# Patient Record
Sex: Female | Born: 1950
Health system: Southern US, Community
[De-identification: ages and names within clinical notes are randomized; demographics above are authoritative.]

## PROBLEM LIST (undated history)

## (undated) DIAGNOSIS — M1990 Unspecified osteoarthritis, unspecified site: Secondary | ICD-10-CM

## (undated) DIAGNOSIS — T753XXA Motion sickness, initial encounter: Secondary | ICD-10-CM

## (undated) DIAGNOSIS — E079 Disorder of thyroid, unspecified: Secondary | ICD-10-CM

## (undated) DIAGNOSIS — C801 Malignant (primary) neoplasm, unspecified: Secondary | ICD-10-CM

## (undated) DIAGNOSIS — Z86018 Personal history of other benign neoplasm: Secondary | ICD-10-CM

## (undated) DIAGNOSIS — E039 Hypothyroidism, unspecified: Secondary | ICD-10-CM

## (undated) DIAGNOSIS — R011 Cardiac murmur, unspecified: Secondary | ICD-10-CM

## (undated) DIAGNOSIS — K635 Polyp of colon: Secondary | ICD-10-CM

## (undated) DIAGNOSIS — M8580 Other specified disorders of bone density and structure, unspecified site: Secondary | ICD-10-CM

## (undated) DIAGNOSIS — K8020 Calculus of gallbladder without cholecystitis without obstruction: Secondary | ICD-10-CM

## (undated) DIAGNOSIS — Z8489 Family history of other specified conditions: Secondary | ICD-10-CM

## (undated) DIAGNOSIS — E785 Hyperlipidemia, unspecified: Secondary | ICD-10-CM

## (undated) DIAGNOSIS — Z860100 Personal history of colon polyps, unspecified: Secondary | ICD-10-CM

## (undated) DIAGNOSIS — K3580 Unspecified acute appendicitis: Secondary | ICD-10-CM

## (undated) HISTORY — DX: Hyperlipidemia, unspecified: E78.5

## (undated) HISTORY — DX: Disorder of thyroid, unspecified: E07.9

## (undated) HISTORY — DX: Calculus of gallbladder without cholecystitis without obstruction: K80.20

## (undated) HISTORY — DX: Polyp of colon: K63.5

## (undated) HISTORY — DX: Personal history of colonic polyps: Z86.010

## (undated) HISTORY — DX: Unspecified acute appendicitis: K35.80

## (undated) HISTORY — DX: Other specified disorders of bone density and structure, unspecified site: M85.80

## (undated) HISTORY — DX: Personal history of other benign neoplasm: Z86.018

## (undated) HISTORY — DX: Personal history of colon polyps, unspecified: Z86.0100

---
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
## 1983-12-30 HISTORY — PX: TUBAL LIGATION: SHX77

## 2015-07-29 ENCOUNTER — Encounter: Payer: Self-pay | Admitting: Family Medicine

## 2015-07-29 DIAGNOSIS — E039 Hypothyroidism, unspecified: Secondary | ICD-10-CM | POA: Insufficient documentation

## 2015-07-29 DIAGNOSIS — E785 Hyperlipidemia, unspecified: Secondary | ICD-10-CM | POA: Insufficient documentation

## 2015-07-29 DIAGNOSIS — D126 Benign neoplasm of colon, unspecified: Secondary | ICD-10-CM | POA: Insufficient documentation

## 2015-08-07 ENCOUNTER — Encounter: Payer: Self-pay | Admitting: Family Medicine

## 2015-08-07 ENCOUNTER — Ambulatory Visit (INDEPENDENT_AMBULATORY_CARE_PROVIDER_SITE_OTHER): Payer: BLUE CROSS/BLUE SHIELD | Admitting: Family Medicine

## 2015-08-07 VITALS — BP 122/78 | HR 90 | Temp 98.3°F | Resp 16 | Ht 64.0 in | Wt 163.9 lb

## 2015-08-07 DIAGNOSIS — D126 Benign neoplasm of colon, unspecified: Secondary | ICD-10-CM | POA: Diagnosis not present

## 2015-08-07 DIAGNOSIS — E039 Hypothyroidism, unspecified: Secondary | ICD-10-CM | POA: Diagnosis not present

## 2015-08-07 DIAGNOSIS — E785 Hyperlipidemia, unspecified: Secondary | ICD-10-CM | POA: Diagnosis not present

## 2015-08-07 DIAGNOSIS — Z1211 Encounter for screening for malignant neoplasm of colon: Secondary | ICD-10-CM

## 2015-08-07 NOTE — Progress Notes (Signed)
Name: Lynn Sandoval   MRN: [id number]    DOB: 1951-02-03   Date:08/07/2015       Progress Note  Subjective  Chief Complaint  Chief Complaint  Patient presents with  . Establish Care  . Labs Only  . Medication Refill    synthroid & atovastatin  . Referral    colonoscopy    HPI  [name] is a 63 year old female who is transitioning care from her previous medical provider Dr. Jenne Pane. Previous records reviewed prior to appointment and scanned into EMR. She has been off of her thyroid medication and cholesterol medication since 02/2015 and notes no symptoms. She would like a referral for repeat C-scope as she has had adenomatous polyps 5 years ago and needs a repeat. Her paternal grandmother has had colon cancer. Other than that she will schedule a physical for mammogram and PAP test as she has not had these in many years as well. No complaints or symptoms expressed today.  Patient Active Problem List   Diagnosis Date Noted  . Hyperlipidemia LDL goal <100 07/29/2015  . Hypothyroidism, adult 07/29/2015  . Adenomatous colon polyp 07/29/2015    History  Substance Use Topics  . Smoking status: Never Smoker   . Smokeless tobacco: Not on file  . Alcohol Use: 0.0 oz/week    0 Standard drinks or equivalent per week     Comment: ocassional glass of wine     Current outpatient prescriptions:  .  atorvastatin (LIPITOR) 10 MG tablet, Take 10 mg by mouth daily., Disp: , Rfl:  .  levothyroxine (SYNTHROID, LEVOTHROID) 50 MCG tablet, Take 50 mcg by mouth daily before breakfast., Disp: , Rfl:   Past Surgical History  Procedure Laterality Date  . Tonsillectomy  1960  . Cesarean section  1981, [id number], 1985  . Eye surgery  1955    Family History  Problem Relation Age of Onset  . Depression Mother   . Hearing loss Mother   . Hyperlipidemia Mother   . Hypertension Mother   . Glaucoma Mother   . Cancer Mother     melanoma  . Heart disease Father   . Heart attack Father   .  Diabetes Maternal Grandmother   . Cancer Paternal Grandmother     colon    No Known Allergies   Review of Systems  CONSTITUTIONAL: No significant weight changes, fever, chills, weakness or fatigue.  HEENT:  - Eyes: No visual changes.  - Ears: No auditory changes. No pain.  - Nose: No sneezing, congestion, runny nose. - Throat: No sore throat. No changes in swallowing. SKIN: No rash or itching.  CARDIOVASCULAR: No chest pain, chest pressure or chest discomfort. No palpitations or edema.  RESPIRATORY: No shortness of breath, cough or sputum.  GASTROINTESTINAL: No anorexia, nausea, vomiting. No changes in bowel habits. No abdominal pain or blood.  GENITOURINARY: No dysuria. No frequency. No discharge.  NEUROLOGICAL: No headache, dizziness, syncope, paralysis, ataxia, numbness or tingling in the extremities. No memory changes. No change in bowel or bladder control.  MUSCULOSKELETAL: No joint pain. No muscle pain. HEMATOLOGIC: No anemia, bleeding or bruising.  LYMPHATICS: No enlarged lymph nodes.  PSYCHIATRIC: No change in mood. No change in sleep pattern.  ENDOCRINOLOGIC: No reports of sweating, cold or heat intolerance. No polyuria or polydipsia.     Objective  BP 122/78 mmHg  Pulse 90  Temp(Src) 98.3 F (36.8 C) (Oral)  Resp 16  Ht 5\' 4"  (1.626 m)  Wt 163  lb 14.4 oz (74.345 kg)  BMI 28.12 kg/m2  SpO2 96% Body mass index is 28.12 kg/(m^2).  Physical Exam  Constitutional: Patient appears well-developed and well-nourished. In no distress.  HEENT:  - Head: Normocephalic and atraumatic.  - Ears: Bilateral TMs gray, no erythema or effusion - Nose: Nasal mucosa moist - Mouth/Throat: Oropharynx is clear and moist. No tonsillar hypertrophy or erythema. No post nasal drainage.  - Eyes: Conjunctivae clear, EOM movements normal. PERRLA. No scleral icterus.  Neck: Normal range of motion. Neck supple. No JVD present. No thyromegaly present.  Cardiovascular: Normal rate, regular  rhythm and normal heart sounds.  No murmur heard.  Pulmonary/Chest: Effort normal and breath sounds normal. No respiratory distress. Musculoskeletal: Normal range of motion bilateral UE and LE, no joint effusions. Peripheral vascular: Bilateral LE no edema. Neurological: CN II-XII grossly intact with no focal deficits. Alert and oriented to person, place, and time. Coordination, balance, strength, speech and gait are normal.  Skin: Skin is warm and dry. No rash noted. No erythema.  Psychiatric: Patient has a normal mood and affect. Behavior is normal in office today. Judgment and thought content normal in office today.   Assessment & Plan  1. Hypothyroidism, adult Recheck blood work off medication for several months now.  - CBC with Differential/Platelet - Comprehensive metabolic panel - TSH - T3, free - T4, free  2. Adenomatous colon polyp Referral placed.   - Ambulatory referral to General Surgery - CBC with Differential/Platelet - Comprehensive metabolic panel  3. Hyperlipidemia LDL goal <100 Recheck blood work off medication for several months now.  - CBC with Differential/Platelet - Comprehensive metabolic panel - Lipid panel  4. Colon cancer screening Referral placed. - Ambulatory referral to General Surgery

## 2015-08-08 LAB — CBC WITH DIFFERENTIAL/PLATELET
Basophils Absolute: 0 10*3/uL (ref 0.0–0.2)
Basos: 1 %
EOS (ABSOLUTE): 0.2 10*3/uL (ref 0.0–0.4)
Eos: 3 %
Hematocrit: 41.4 % (ref 34.0–46.6)
Hemoglobin: 14.4 g/dL (ref 11.1–15.9)
Immature Grans (Abs): 0 10*3/uL (ref 0.0–0.1)
Immature Granulocytes: 0 %
Lymphocytes Absolute: 2.1 10*3/uL (ref 0.7–3.1)
Lymphs: 38 %
MCH: 28.6 pg (ref 26.6–33.0)
MCHC: 34.8 g/dL (ref 31.5–35.7)
MCV: 82 fL (ref 79–97)
Monocytes Absolute: 0.4 10*3/uL (ref 0.1–0.9)
Monocytes: 7 %
Neutrophils Absolute: 2.8 10*3/uL (ref 1.4–7.0)
Neutrophils: 51 %
Platelets: 317 10*3/uL (ref 150–379)
RBC: 5.04 x10E6/uL (ref 3.77–5.28)
RDW: 13.8 % (ref 12.3–15.4)
WBC: 5.5 10*3/uL (ref 3.4–10.8)

## 2015-08-09 ENCOUNTER — Other Ambulatory Visit: Payer: Self-pay | Admitting: Family Medicine

## 2015-08-09 DIAGNOSIS — E039 Hypothyroidism, unspecified: Secondary | ICD-10-CM

## 2015-08-09 LAB — T4, FREE: Free T4: 1.22 ng/dL (ref 0.82–1.77)

## 2015-08-09 LAB — COMPREHENSIVE METABOLIC PANEL
ALT: 14 IU/L (ref 0–32)
AST: 16 IU/L (ref 0–40)
Albumin/Globulin Ratio: 2.3 (ref 1.1–2.5)
Albumin: 4.5 g/dL (ref 3.6–4.8)
Alkaline Phosphatase: 99 IU/L (ref 39–117)
BUN/Creatinine Ratio: 16 (ref 11–26)
BUN: 13 mg/dL (ref 8–27)
Bilirubin Total: 0.3 mg/dL (ref 0.0–1.2)
CO2: 23 mmol/L (ref 18–29)
Calcium: 9.1 mg/dL (ref 8.7–10.3)
Chloride: 100 mmol/L (ref 97–108)
Creatinine, Ser: 0.8 mg/dL (ref 0.57–1.00)
GFR calc Af Amer: 91 mL/min/{1.73_m2} (ref >59)
GFR calc non Af Amer: 79 mL/min/{1.73_m2} (ref >59)
Globulin, Total: 2 g/dL (ref 1.5–4.5)
Glucose: 97 mg/dL (ref 65–99)
Potassium: 4.6 mmol/L (ref 3.5–5.2)
Sodium: 141 mmol/L (ref 134–144)
Total Protein: 6.5 g/dL (ref 6.0–8.5)

## 2015-08-09 LAB — LIPID PANEL
Chol/HDL Ratio: 4.1 ratio units (ref 0.0–4.4)
Cholesterol, Total: 301 mg/dL — ABNORMAL HIGH (ref 100–199)
HDL: 74 mg/dL (ref >39)
LDL Calculated: 211 mg/dL — ABNORMAL HIGH (ref 0–99)
Triglycerides: 81 mg/dL (ref 0–149)
VLDL Cholesterol Cal: 16 mg/dL (ref 5–40)

## 2015-08-09 LAB — TSH: TSH: 5.74 u[IU]/mL — ABNORMAL HIGH (ref 0.450–4.500)

## 2015-08-09 LAB — T3, FREE: T3, Free: 3.2 pg/mL (ref 2.0–4.4)

## 2015-08-09 MED ORDER — LEVOTHYROXINE SODIUM 75 MCG PO TABS: 75.0000 ug | ORAL_TABLET | Freq: Every day | ORAL | Status: DC

## 2015-08-13 ENCOUNTER — Telehealth: Payer: Self-pay | Admitting: Family Medicine

## 2015-08-13 NOTE — Telephone Encounter (Signed)
I tried to contact this patient to review the results from her most recent labs, but there was no answer. A message was left for the patient to give [place] a call when she got the chance.

## 2015-08-13 NOTE — Telephone Encounter (Signed)
Patient was informed of results and that she will need repeat labs in 3 months, which will give her enough time to see if increased dosage has helped. Patient was told that we will call her when her lab order was ready and will scheduled f/u appt for that week. She said ok and thanks.  Patient was scheduled for an appt on 11/12/15 at 9:30am, so she can have her labs done on the previous Friday (11/09/15).

## 2015-08-13 NOTE — Telephone Encounter (Signed)
NEEDS LAB RESULTS

## 2015-08-13 NOTE — Telephone Encounter (Signed)
Patient is returning your call (c) [phone]

## 2015-08-20 ENCOUNTER — Other Ambulatory Visit: Payer: Self-pay

## 2015-08-20 ENCOUNTER — Telehealth: Payer: Self-pay | Admitting: Gastroenterology

## 2015-08-20 NOTE — Telephone Encounter (Signed)
Gastroenterology Pre-Procedure Review  Request Date: 09-17-2015   Requesting Physician: Dr. Bobetta Lime  PATIENT REVIEW QUESTIONS: The patient responded to the following health history questions as indicated:    1. Are you having any GI issues? no 2. Do you have a personal history of Polyps? yes ( ) 3. Do you have a family history of Colon Cancer or Polyps? yes (grandmother but not mother of father) 9. Diabetes Mellitus? no 5. Joint replacements in the past 12 months?no 6. Major health problems in the past 3 months?no 7. Any artificial heart valves, MVP, or defibrillator?no    MEDICATIONS & ALLERGIES:    Patient reports the following regarding taking any anticoagulation/antiplatelet therapy:   Plavix, Coumadin, Eliquis, Xarelto, Lovenox, Pradaxa, Brilinta, or Effient? no Aspirin? no  Patient confirms/reports the following medications:  Current Outpatient Prescriptions  Medication Sig Dispense Refill   atorvastatin (LIPITOR) 10 MG tablet Take 10 mg by mouth daily.     levothyroxine (SYNTHROID, LEVOTHROID) 75 MCG tablet Take 1 tablet (75 mcg total) by mouth daily before breakfast. 90 tablet 1   No current facility-administered medications for this visit.    Patient confirms/reports the following allergies:  No Known Allergies  No orders of the defined types were placed in this encounter.    AUTHORIZATION INFORMATION Primary Insurance: 1D#: Group #:  Secondary Insurance: 1D#: Group #:  SCHEDULE INFORMATION: Date: 09-17-2015 Time: Location:[facility]

## 2015-08-22 ENCOUNTER — Encounter: Payer: Self-pay | Admitting: Family Medicine

## 2015-09-10 ENCOUNTER — Encounter: Payer: Self-pay | Admitting: *Deleted

## 2015-09-11 ENCOUNTER — Encounter: Payer: Self-pay | Admitting: Student in an Organized Health Care Education/Training Program

## 2015-09-14 NOTE — Discharge Instructions (Signed)

## 2015-09-17 ENCOUNTER — Ambulatory Visit
Admission: RE | Admit: 2015-09-17 | Payer: BLUE CROSS/BLUE SHIELD | Source: Ambulatory Visit | Admitting: Gastroenterology

## 2015-09-17 HISTORY — DX: Motion sickness, initial encounter: T75.3XXA

## 2015-09-17 HISTORY — DX: Unspecified osteoarthritis, unspecified site: M19.90

## 2015-09-17 HISTORY — DX: Family history of other specified conditions: Z84.89

## 2015-09-17 SURGERY — COLONOSCOPY WITH PROPOFOL
Anesthesia: Choice

## 2015-10-10 ENCOUNTER — Other Ambulatory Visit: Payer: Self-pay | Admitting: Family Medicine

## 2015-10-10 DIAGNOSIS — E039 Hypothyroidism, unspecified: Secondary | ICD-10-CM

## 2015-10-10 NOTE — Telephone Encounter (Signed)
Patient is requesting a refill on Levothyroxine. She had two appointments scheduled however had to cancel both due to not having insurance. She will call back to reschedule. Requesting the refill to be sent to [facility]

## 2015-10-10 NOTE — Telephone Encounter (Signed)
Refill request was sent to Dr. Ashany Sundaram for approval and submission.  

## 2015-10-11 MED ORDER — LEVOTHYROXINE SODIUM 75 MCG PO TABS: 75.0000 ug | ORAL_TABLET | Freq: Every day | ORAL | Status: DC

## 2015-10-15 ENCOUNTER — Encounter: Payer: BLUE CROSS/BLUE SHIELD | Admitting: Family Medicine

## 2015-11-12 ENCOUNTER — Ambulatory Visit: Payer: Self-pay | Admitting: Family Medicine

## 2016-01-14 ENCOUNTER — Telehealth: Payer: Self-pay | Admitting: Gastroenterology

## 2016-01-14 ENCOUNTER — Other Ambulatory Visit: Payer: Self-pay

## 2016-01-14 NOTE — Telephone Encounter (Signed)
Patient would like to reschedule her colonoscopy from last September.

## 2016-01-14 NOTE — Telephone Encounter (Signed)
Contacted pt and rescheduled screening colonoscopy for 02/04/16 at [facility]. Pt still had instructions and rx.

## 2016-01-21 ENCOUNTER — Ambulatory Visit (INDEPENDENT_AMBULATORY_CARE_PROVIDER_SITE_OTHER): Payer: BLUE CROSS/BLUE SHIELD | Admitting: Family Medicine

## 2016-01-21 ENCOUNTER — Encounter: Payer: Self-pay | Admitting: Family Medicine

## 2016-01-21 VITALS — BP 124/82 | HR 95 | Temp 98.6°F | Resp 16 | Ht 64.0 in | Wt 167.0 lb

## 2016-01-21 DIAGNOSIS — Z1231 Encounter for screening mammogram for malignant neoplasm of breast: Secondary | ICD-10-CM | POA: Diagnosis not present

## 2016-01-21 DIAGNOSIS — Z136 Encounter for screening for cardiovascular disorders: Secondary | ICD-10-CM | POA: Diagnosis not present

## 2016-01-21 DIAGNOSIS — Z113 Encounter for screening for infections with a predominantly sexual mode of transmission: Secondary | ICD-10-CM | POA: Diagnosis not present

## 2016-01-21 DIAGNOSIS — Z23 Encounter for immunization: Secondary | ICD-10-CM | POA: Diagnosis not present

## 2016-01-21 DIAGNOSIS — IMO0001 Reserved for inherently not codable concepts without codable children: Secondary | ICD-10-CM | POA: Insufficient documentation

## 2016-01-21 DIAGNOSIS — Z13220 Encounter for screening for lipoid disorders: Secondary | ICD-10-CM | POA: Diagnosis not present

## 2016-01-21 DIAGNOSIS — E039 Hypothyroidism, unspecified: Secondary | ICD-10-CM | POA: Diagnosis not present

## 2016-01-21 DIAGNOSIS — Z0000 Encounter for general adult medical examination without abnormal findings: Secondary | ICD-10-CM | POA: Insufficient documentation

## 2016-01-21 DIAGNOSIS — Z124 Encounter for screening for malignant neoplasm of cervix: Secondary | ICD-10-CM | POA: Diagnosis not present

## 2016-01-21 NOTE — Progress Notes (Signed)
Name: Lynn Sandoval   MRN: [id number]    DOB: 08-03-1951   Date:01/21/2016       Progress Note  Subjective  Chief Complaint  Chief Complaint  Patient presents with  . Annual Exam    HPI  Patient is here today for a Complete Female Physical Exam:  [name] is a 65 year old female with history of hypothyroidism, colonic adenomatous polyp, family history colon cancer in paternal grandmother.  Repeat C-scope is scheduled for 01/2016.  In regards to there hypothyroidism her last labs in 07/2015 showed elevated TSH so her levothyroxine dose was increased. Symptoms consist of fatigue. Symptoms have present for several years. The symptoms are transient.  The problem has been stable.  Previous thyroid studies include TSH, triiodothyronine free and free thyroxine. The hypothyroidism is due to Hashimoto's disease.   The patient has has no unusual complaints. Overall feels healthy. Diet is well balanced. In general does exercise regularly but not in the past few months as she has been busy watching her granddaughter until March 2017. Sees dentist regularly and addresses vision concerns with ophthalmologist if applicable. In regards to sexual activity the patient is currently sexually active. Currently is not concerned about exposure to any STDs. She is post menopausal.   Past Medical History  Diagnosis Date  . Hyperlipidemia   . Thyroid disease   . Colon polyps   . Family history of adverse reaction to anesthesia     son - slow to wake  . Arthritis     fingers  . Motion sickness     boats    Past Surgical History  Procedure Laterality Date  . Tonsillectomy  1960  . Cesarean section  1981, [id number], 1985  . Eye surgery  1955    Family History  Problem Relation Age of Onset  . Depression Mother   . Hearing loss Mother   . Hyperlipidemia Mother   . Hypertension Mother   . Glaucoma Mother   . Cancer Mother     melanoma  . Heart disease Father   . Heart attack Father   . Diabetes  Maternal Grandmother   . Cancer Paternal Grandmother     colon    Social History   Social History  . Marital Status: Married    Spouse Name: N/A  . Number of Children: 4  . Years of Education: N/A   Occupational History  . retired    Social History Main Topics  . Smoking status: Never Smoker   . Smokeless tobacco: Not on file  . Alcohol Use: 1.8 oz/week    0 Standard drinks or equivalent, 3 Glasses of wine per week     Comment: ocassional glass of wine  . Drug Use: No  . Sexual Activity: No   Other Topics Concern  . Not on file   Social History Narrative     Current outpatient prescriptions:  .  CALCIUM PO, Take by mouth., Disp: , Rfl:  .  levothyroxine (SYNTHROID, LEVOTHROID) 75 MCG tablet, Take 1 tablet (75 mcg total) by mouth daily before breakfast., Disp: 90 tablet, Rfl: 1 .  Omega-3 Fatty Acids (FISH OIL PO), Take by mouth., Disp: , Rfl:   No Known Allergies  ROS  CONSTITUTIONAL: No significant weight changes, fever, chills, weakness or fatigue.  HEENT:  - Eyes: No visual changes.  - Ears: No auditory changes. No pain.  - Nose: No sneezing, congestion, runny nose. - Throat: No sore throat. No changes in swallowing.  SKIN: No rash or itching.  CARDIOVASCULAR: No chest pain, chest pressure or chest discomfort. No palpitations or edema.  RESPIRATORY: No shortness of breath, cough or sputum.  GASTROINTESTINAL: No anorexia, nausea, vomiting. No changes in bowel habits. No abdominal pain or blood.  GENITOURINARY: No dysuria. No frequency. No discharge.  NEUROLOGICAL: No headache, dizziness, syncope, paralysis, ataxia, numbness or tingling in the extremities. No memory changes. No change in bowel or bladder control.  MUSCULOSKELETAL: No joint pain. No muscle pain. HEMATOLOGIC: No anemia, bleeding or bruising.  LYMPHATICS: No enlarged lymph nodes.  PSYCHIATRIC: No change in mood. No change in sleep pattern.  ENDOCRINOLOGIC: No reports of sweating, cold or heat  intolerance. No polyuria or polydipsia.   Objective  Filed Vitals:   01/21/16 1403  BP: 124/82  Pulse: 95  Temp: 98.6 F (37 C)  TempSrc: Oral  Resp: 16  Height: 5\' 4"  (1.626 m)  Weight: 167 lb (75.751 kg)  SpO2: 95%   Body mass index is 28.65 kg/(m^2).  Depression screen [facility] 2/9 01/21/2016 08/07/2015  Decreased Interest 0 1  Down, Depressed, Hopeless 1 1  PHQ - 2 Score 1 2  Altered sleeping - 3  Tired, decreased energy - 2  Change in appetite - 0  Feeling bad or failure about yourself  - 1  Trouble concentrating - 1  Moving slowly or fidgety/restless - 0  Suicidal thoughts - 0  PHQ-9 Score - 9  Difficult doing work/chores - Not difficult at all     Physical Exam  Constitutional: Patient appears well-developed and well-nourished. In no distress.  HEENT:  - Head: Normocephalic and atraumatic.  - Ears: Bilateral TMs gray, no erythema or effusion - Nose: Nasal mucosa moist - Mouth/Throat: Oropharynx is clear and moist. No tonsillar hypertrophy or erythema. No post nasal drainage.  - Eyes: Conjunctivae clear, EOM movements normal. PERRLA. No scleral icterus. Wearing corrective eye glasses.  Neck: Normal range of motion. Neck supple. No JVD present. No thyromegaly present.  Cardiovascular: Normal rate, regular rhythm and normal heart sounds.  No murmur heard.  Pulmonary/Chest: Effort normal and breath sounds normal. No respiratory distress. Abdominal: Soft. Bowel sounds are normal, no distension. There is no tenderness. no masses BREAST: Bilateral breast exam normal with no masses, skin changes or nipple discharge FEMALE GENITALIA: Pedunculated skin tag left inner thigh about 67mm length. External genitalia normal External urethra normal Vaginal vault normal without discharge or lesions Cervix normal without discharge or lesions Bimanual exam normal without masses RECTAL: no rectal masses or hemorrhoids Musculoskeletal: Normal range of motion bilateral UE and LE, no  joint effusions. Peripheral vascular: Bilateral LE no edema. Neurological: CN II-XII grossly intact with no focal deficits. Alert and oriented to person, place, and time. Coordination, balance, strength, speech and gait are normal.  Skin: Skin is warm and dry. Scattered nevi, cherry hemangiomas, seborrheic keratosis.  Psychiatric: Patient has a normal mood and affect. Behavior is normal in office today. Judgment and thought content normal in office today.   Assessment & Plan  1. Annual physical exam Discussed in detail all recommended preventative measures appropriate for age and gender now and in the future.  Recommended dermatological evaluation of multiple skin lesions.  - CBC with Differential/Platelet - Comprehensive metabolic panel  2. Needs flu shot  - Flu Vaccine QUAD 36+ mos PF IM (Fluarix & Fluzone Quad PF)  3. Encounter for cholesteral screening for cardiovascular disease  - Lipid panel  4. Hypothyroidism, adult Doing well on current dose. Due  to have thyroid hormone labs checked.  - CBC with Differential/Platelet - Comprehensive metabolic panel - Lipid panel - TSH - T3, free - T4, free  5. Screening for STD (sexually transmitted disease)  - HIV antibody - Hepatitis C antibody  6. Encounter for screening mammogram for malignant neoplasm of breast  - MM Digital Screening; Future  7. Encounter for screening for cervical cancer   - Pap IG w/ reflex to HPV when ASC-U

## 2016-01-22 NOTE — Telephone Encounter (Signed)
No authorization is required for CPT: [phone] with [employer] per [facility]--Reference number [id number] as long as this is done as outpatient.

## 2016-01-24 LAB — CBC WITH DIFFERENTIAL/PLATELET
Basophils Absolute: 0 10*3/uL (ref 0.0–0.2)
Basos: 1 %
EOS (ABSOLUTE): 0.2 10*3/uL (ref 0.0–0.4)
Eos: 3 %
Hematocrit: 42.9 % (ref 34.0–46.6)
Hemoglobin: 14.5 g/dL (ref 11.1–15.9)
Immature Grans (Abs): 0 10*3/uL (ref 0.0–0.1)
Immature Granulocytes: 0 %
Lymphocytes Absolute: 2.1 10*3/uL (ref 0.7–3.1)
Lymphs: 34 %
MCH: 28.6 pg (ref 26.6–33.0)
MCHC: 33.8 g/dL (ref 31.5–35.7)
MCV: 85 fL (ref 79–97)
Monocytes Absolute: 0.4 10*3/uL (ref 0.1–0.9)
Monocytes: 7 %
Neutrophils Absolute: 3.3 10*3/uL (ref 1.4–7.0)
Neutrophils: 55 %
Platelets: 283 10*3/uL (ref 150–379)
RBC: 5.07 x10E6/uL (ref 3.77–5.28)
RDW: 13.8 % (ref 12.3–15.4)
WBC: 6 10*3/uL (ref 3.4–10.8)

## 2016-01-24 LAB — COMPREHENSIVE METABOLIC PANEL
ALT: 21 IU/L (ref 0–32)
AST: 18 IU/L (ref 0–40)
Albumin/Globulin Ratio: 2.1 (ref 1.1–2.5)
Albumin: 4.5 g/dL (ref 3.6–4.8)
Alkaline Phosphatase: 103 IU/L (ref 39–117)
BUN/Creatinine Ratio: 21 (ref 11–26)
BUN: 16 mg/dL (ref 8–27)
Bilirubin Total: 0.4 mg/dL (ref 0.0–1.2)
CO2: 24 mmol/L (ref 18–29)
Calcium: 8.9 mg/dL (ref 8.7–10.3)
Chloride: 100 mmol/L (ref 96–106)
Creatinine, Ser: 0.78 mg/dL (ref 0.57–1.00)
GFR calc Af Amer: 94 mL/min/{1.73_m2} (ref >59)
GFR calc non Af Amer: 81 mL/min/{1.73_m2} (ref >59)
Globulin, Total: 2.1 g/dL (ref 1.5–4.5)
Glucose: 95 mg/dL (ref 65–99)
Potassium: 4.5 mmol/L (ref 3.5–5.2)
Sodium: 141 mmol/L (ref 134–144)
Total Protein: 6.6 g/dL (ref 6.0–8.5)

## 2016-01-24 LAB — PAP IG W/ RFLX HPV ASCU: PAP Smear Comment: 0

## 2016-01-24 LAB — LIPID PANEL
Chol/HDL Ratio: 3.9 ratio units (ref 0.0–4.4)
Cholesterol, Total: 283 mg/dL — ABNORMAL HIGH (ref 100–199)
HDL: 73 mg/dL (ref >39)
LDL Calculated: 191 mg/dL — ABNORMAL HIGH (ref 0–99)
Triglycerides: 94 mg/dL (ref 0–149)
VLDL Cholesterol Cal: 19 mg/dL (ref 5–40)

## 2016-01-24 LAB — TSH: TSH: 2.05 u[IU]/mL (ref 0.450–4.500)

## 2016-01-24 LAB — T3, FREE: T3, Free: 3.4 pg/mL (ref 2.0–4.4)

## 2016-01-24 LAB — HEPATITIS C ANTIBODY: Hep C Virus Ab: 0.9 s/co ratio (ref 0.0–0.9)

## 2016-01-24 LAB — T4, FREE: Free T4: 1.7 ng/dL (ref 0.82–1.77)

## 2016-01-24 LAB — HIV ANTIBODY (ROUTINE TESTING W REFLEX): HIV Screen 4th Generation wRfx: NONREACTIVE

## 2016-01-31 ENCOUNTER — Ambulatory Visit: Payer: BLUE CROSS/BLUE SHIELD

## 2016-01-31 NOTE — Discharge Instructions (Signed)

## 2016-02-04 ENCOUNTER — Ambulatory Visit
Admission: RE | Admit: 2016-02-04 | Discharge: 2016-02-04 | Disposition: A | Discharge status: Home / Self Care | Payer: BLUE CROSS/BLUE SHIELD | Source: Ambulatory Visit | Attending: Gastroenterology | Admitting: Gastroenterology

## 2016-02-04 ENCOUNTER — Encounter
Admission: RE | Disposition: A | Discharge status: Home / Self Care | Payer: Self-pay | Source: Ambulatory Visit | Attending: Gastroenterology

## 2016-02-04 ENCOUNTER — Other Ambulatory Visit: Payer: Self-pay | Admitting: Gastroenterology

## 2016-02-04 ENCOUNTER — Ambulatory Visit: Payer: BLUE CROSS/BLUE SHIELD | Admitting: Anesthesiology

## 2016-02-04 ENCOUNTER — Encounter: Payer: Self-pay | Admitting: *Deleted

## 2016-02-04 DIAGNOSIS — Z9889 Other specified postprocedural states: Secondary | ICD-10-CM | POA: Insufficient documentation

## 2016-02-04 DIAGNOSIS — Z818 Family history of other mental and behavioral disorders: Secondary | ICD-10-CM | POA: Insufficient documentation

## 2016-02-04 DIAGNOSIS — Z8249 Family history of ischemic heart disease and other diseases of the circulatory system: Secondary | ICD-10-CM | POA: Diagnosis not present

## 2016-02-04 DIAGNOSIS — E039 Hypothyroidism, unspecified: Secondary | ICD-10-CM | POA: Insufficient documentation

## 2016-02-04 DIAGNOSIS — M19049 Primary osteoarthritis, unspecified hand: Secondary | ICD-10-CM | POA: Insufficient documentation

## 2016-02-04 DIAGNOSIS — Z860100 Personal history of colon polyps, unspecified: Secondary | ICD-10-CM | POA: Insufficient documentation

## 2016-02-04 DIAGNOSIS — D122 Benign neoplasm of ascending colon: Secondary | ICD-10-CM | POA: Diagnosis not present

## 2016-02-04 DIAGNOSIS — D123 Benign neoplasm of transverse colon: Secondary | ICD-10-CM | POA: Insufficient documentation

## 2016-02-04 DIAGNOSIS — Z79899 Other long term (current) drug therapy: Secondary | ICD-10-CM | POA: Diagnosis not present

## 2016-02-04 DIAGNOSIS — Z1211 Encounter for screening for malignant neoplasm of colon: Secondary | ICD-10-CM | POA: Diagnosis present

## 2016-02-04 DIAGNOSIS — Z833 Family history of diabetes mellitus: Secondary | ICD-10-CM | POA: Diagnosis not present

## 2016-02-04 DIAGNOSIS — Z800 Family history of malignant neoplasm of digestive organs: Secondary | ICD-10-CM | POA: Insufficient documentation

## 2016-02-04 DIAGNOSIS — E785 Hyperlipidemia, unspecified: Secondary | ICD-10-CM | POA: Diagnosis not present

## 2016-02-04 DIAGNOSIS — K641 Second degree hemorrhoids: Secondary | ICD-10-CM | POA: Diagnosis not present

## 2016-02-04 DIAGNOSIS — Z808 Family history of malignant neoplasm of other organs or systems: Secondary | ICD-10-CM | POA: Insufficient documentation

## 2016-02-04 DIAGNOSIS — Z83511 Family history of glaucoma: Secondary | ICD-10-CM | POA: Insufficient documentation

## 2016-02-04 HISTORY — PX: POLYPECTOMY: SHX5525

## 2016-02-04 HISTORY — PX: COLONOSCOPY WITH PROPOFOL: SHX5780

## 2016-02-04 HISTORY — DX: Hypothyroidism, unspecified: E03.9

## 2016-02-04 SURGERY — COLONOSCOPY WITH PROPOFOL
Anesthesia: Monitor Anesthesia Care

## 2016-02-04 MED ORDER — ACETAMINOPHEN 325 MG PO TABS: 325.0000 mg | ORAL_TABLET | ORAL | Status: DC | PRN

## 2016-02-04 MED ORDER — LACTATED RINGERS IV SOLN
INTRAVENOUS | Status: DC
  Administered 2016-02-04: 08:00:00 via INTRAVENOUS

## 2016-02-04 MED ORDER — STERILE WATER FOR IRRIGATION IR SOLN
Status: DC | PRN
  Administered 2016-02-04: 08:00:00

## 2016-02-04 MED ORDER — ACETAMINOPHEN 160 MG/5ML PO SOLN: 325.0000 mg | ORAL | Status: DC | PRN

## 2016-02-04 MED ORDER — PROPOFOL 10 MG/ML IV BOLUS
INTRAVENOUS | Status: DC | PRN
  Administered 2016-02-04 (×3): 20 mg via INTRAVENOUS

## 2016-02-04 SURGICAL SUPPLY — 28 items

## 2016-02-04 NOTE — H&P (Addendum)
  [facility]  [address], [place], [place] [address] Phone: [phone] Fax : [phone]  Primary Care Physician:  Bobetta Lime, MD Primary Gastroenterologist:  Dr. Allen Norris  Pre-Procedure History & Physical: HPI:  Lynn Sandoval is a 65 y.o. female is here for a screening colonoscopy.   Past Medical History  Diagnosis Date  . Hyperlipidemia   . Thyroid disease   . Colon polyps   . Family history of adverse reaction to anesthesia     son - slow to wake  . Motion sickness     boats  . Arthritis     fingers  . Hypothyroidism     Past Surgical History  Procedure Laterality Date  . Tonsillectomy  1960  . Cesarean section  1981, [id number], 1985  . Eye surgery  1955  . Tonsillectomy      Prior to Admission medications   Medication Sig Start Date End Date Taking? Authorizing Provider  CALCIUM PO Take by mouth.   Yes Historical Provider, MD  levothyroxine (SYNTHROID, LEVOTHROID) 75 MCG tablet Take 1 tablet (75 mcg total) by mouth daily before breakfast. 10/11/15  Yes Bobetta Lime, MD  Omega-3 Fatty Acids (FISH OIL PO) Take by mouth.   Yes Historical Provider, MD    Allergies as of 01/14/2016  . (No Known Allergies)    Family History  Problem Relation Age of Onset  . Depression Mother   . Hearing loss Mother   . Hyperlipidemia Mother   . Hypertension Mother   . Glaucoma Mother   . Cancer Mother     melanoma  . Heart disease Father   . Heart attack Father   . Diabetes Maternal Grandmother   . Cancer Paternal Grandmother     colon    Social History   Social History  . Marital Status: Married    Spouse Name: N/A  . Number of Children: 4  . Years of Education: N/A   Occupational History  . retired    Social History Main Topics  . Smoking status: Never Smoker   . Smokeless tobacco: Not on file  . Alcohol Use: 1.8 oz/week    3 Glasses of wine, 0 Standard drinks or equivalent per week     Comment: ocassional glass of wine  . Drug Use:  No  . Sexual Activity: No   Other Topics Concern  . Not on file   Social History Narrative    Review of Systems: See HPI, otherwise negative ROS  Physical Exam: BP 132/79 mmHg  Pulse 93  Temp(Src) 98.2 F (36.8 C)  Resp 16  Ht 5\' 4"  (1.626 m)  Wt 161 lb (73.029 kg)  BMI 27.62 kg/m2  SpO2 98% General:   Alert,  pleasant and cooperative in NAD Head:  Normocephalic and atraumatic. Neck:  Supple; no masses or thyromegaly. Lungs:  Clear throughout to auscultation.    Heart:  Regular rate and rhythm. Abdomen:  Soft, nontender and nondistended. Normal bowel sounds, without guarding, and without rebound.   Neurologic:  Alert and  oriented x4;  grossly normal neurologically.  Impression/Plan: Lynn Sandoval is now here to undergo a history of polyps Risks, benefits, and alternatives regarding colonoscopy have been reviewed with the patient.  Questions have been answered.  All parties agreeable.

## 2016-02-04 NOTE — Op Note (Signed)
[facility] Gastroenterology Patient Name: Lynn Sandoval Procedure Date: 02/04/2016 8:15 AM MRN: [id number] Account #: [id number] Date of Birth: Feb 11, 1951 Admit Type: Outpatient Age: 65 Room: [facility] OR ROOM 01 Gender: Female Note Status: Finalized Procedure:         Colonoscopy Indications:       High risk colon cancer surveillance: Personal history of                     colonic polyps Providers:         Lucilla Lame, MD Referring MD:      Bobetta Lime (Referring MD) Medicines:         Propofol per Anesthesia Complications:     No immediate complications. Procedure:         Pre-Anesthesia Assessment:                    - Prior to the procedure, a History and Physical was                     performed, and patient medications and allergies were                     reviewed. The patient's tolerance of previous anesthesia                     was also reviewed. The risks and benefits of the procedure                     and the sedation options and risks were discussed with the                     patient. All questions were answered, and informed consent                     was obtained. Prior Anticoagulants: The patient has taken                     no previous anticoagulant or antiplatelet agents. ASA                     Grade Assessment: II - A patient with mild systemic                     disease. After reviewing the risks and benefits, the                     patient was deemed in satisfactory condition to undergo                     the procedure.                    After obtaining informed consent, the colonoscope was                     passed under direct vision. Throughout the procedure, the                     patient's blood pressure, pulse, and oxygen saturations                     were monitored continuously. The [place]  colonoscope (S#: [id number]) was introduced through the anus                     and advanced to the the  cecum, identified by appendiceal                     orifice and ileocecal valve. The colonoscopy was performed                     without difficulty. The patient tolerated the procedure                     well. The quality of the bowel preparation was excellent. Findings:      The perianal and digital rectal examinations were normal.      A 4 mm polyp was found in the ascending colon. The polyp was sessile.       The polyp was removed with a cold biopsy forceps. Resection and       retrieval were complete.      A 4 mm polyp was found in the transverse colon. The polyp was sessile.       The polyp was removed with a cold biopsy forceps. Resection and       retrieval were complete.      Non-bleeding internal hemorrhoids were found during retroflexion. The       hemorrhoids were Grade II (internal hemorrhoids that prolapse but reduce       spontaneously). Impression:        - One 4 mm polyp in the ascending colon. Resected and                     retrieved.                    - One 4 mm polyp in the transverse colon. Resected and                     retrieved.                    - Non-bleeding internal hemorrhoids. Recommendation:    - Await pathology results.                    - Repeat colonoscopy in 5 years for surveillance. Procedure Code(s): --- Professional ---                    [phone], Colonoscopy, flexible; with biopsy, single or                     multiple Diagnosis Code(s): --- Professional ---                    Z86.010, Personal history of colonic polyps                    D12.2, Benign neoplasm of ascending colon                    D12.3, Benign neoplasm of transverse colon CPT copyright 2014 American Medical Association. All rights reserved. The codes documented in this report are preliminary and upon coder review may  be revised to meet current compliance requirements. Lucilla Lame, MD 02/04/2016 8:37:08 AM This report has been signed electronically. Number of Addenda:  0 Note Initiated On: 02/04/2016 8:15 AM Scope Withdrawal Time: 0  hours 6 minutes 51 seconds  Total Procedure Duration: 0 hours 11 minutes 5 seconds       [facility]

## 2016-02-04 NOTE — Anesthesia Procedure Notes (Signed)
Procedure Name: MAC Performed by: Elon Eoff Pre-anesthesia Checklist: Patient identified, Emergency Drugs available, Suction available, Patient being monitored and Timeout performed Patient Re-evaluated:Patient Re-evaluated prior to inductionOxygen Delivery Method: Nasal cannula Preoxygenation: Pre-oxygenation with 100% oxygen     

## 2016-02-04 NOTE — Anesthesia Postprocedure Evaluation (Signed)
Anesthesia Post Note  Patient: Lynn Sandoval  Procedure(s) Performed: Procedure(s) (LRB): COLONOSCOPY WITH PROPOFOL (N/A) POLYPECTOMY  Patient location during evaluation: PACU Anesthesia Type: MAC Level of consciousness: awake and alert and oriented Pain management: satisfactory to patient Vital Signs Assessment: post-procedure vital signs reviewed and stable Respiratory status: spontaneous breathing, nonlabored ventilation and respiratory function stable Cardiovascular status: blood pressure returned to baseline and stable Postop Assessment: Adequate PO intake and No signs of nausea or vomiting Anesthetic complications: no    Raliegh Ip

## 2016-02-04 NOTE — Transfer of Care (Signed)
Immediate Anesthesia Transfer of Care Note  Patient: Lynn Sandoval  Procedure(s) Performed: Procedure(s): COLONOSCOPY WITH PROPOFOL (N/A) POLYPECTOMY  Patient Location: PACU  Anesthesia Type: MAC  Level of Consciousness: awake, alert  and patient cooperative  Airway and Oxygen Therapy: Patient Spontanous Breathing and Patient connected to supplemental oxygen  Post-op Assessment: Post-op Vital signs reviewed, Patient's Cardiovascular Status Stable, Respiratory Function Stable, Patent Airway and No signs of Nausea or vomiting  Post-op Vital Signs: Reviewed and stable  Complications: No apparent anesthesia complications

## 2016-02-04 NOTE — Anesthesia Preprocedure Evaluation (Signed)
Anesthesia Evaluation  Patient identified by MRN, date of birth, ID band  Reviewed: Allergy & Precautions, H&P , NPO status , Patient's Chart, lab work & pertinent test results  Airway Mallampati: II  TM Distance: >3 FB Neck ROM: full    Dental no notable dental hx.    Pulmonary    Pulmonary exam normal       Cardiovascular Rhythm:regular Rate:Normal     Neuro/Psych    GI/Hepatic   Endo/Other  Hypothyroidism   Renal/GU      Musculoskeletal   Abdominal   Peds  Hematology   Anesthesia Other Findings   Reproductive/Obstetrics                             Anesthesia Physical Anesthesia Plan  ASA: II  Anesthesia Plan: MAC   Post-op Pain Management:    Induction:   Airway Management Planned:   Additional Equipment:   Intra-op Plan:   Post-operative Plan:   Informed Consent: I have reviewed the patients History and Physical, chart, labs and discussed the procedure including the risks, benefits and alternatives for the proposed anesthesia with the patient or authorized representative who has indicated his/her understanding and acceptance.     Plan Discussed with: CRNA  Anesthesia Plan Comments:         Anesthesia Quick Evaluation  

## 2016-02-05 ENCOUNTER — Encounter: Payer: Self-pay | Admitting: Gastroenterology

## 2016-02-06 ENCOUNTER — Ambulatory Visit
Admission: RE | Admit: 2016-02-06 | Discharge: 2016-02-06 | Disposition: A | Discharge status: Home / Self Care | Payer: BLUE CROSS/BLUE SHIELD | Source: Ambulatory Visit | Attending: Family Medicine | Admitting: Family Medicine

## 2016-02-06 ENCOUNTER — Encounter: Payer: Self-pay | Admitting: Gastroenterology

## 2016-02-06 ENCOUNTER — Other Ambulatory Visit: Payer: Self-pay

## 2016-02-06 DIAGNOSIS — Z1231 Encounter for screening mammogram for malignant neoplasm of breast: Secondary | ICD-10-CM

## 2016-02-06 DIAGNOSIS — E039 Hypothyroidism, unspecified: Secondary | ICD-10-CM

## 2016-02-06 MED ORDER — LEVOTHYROXINE SODIUM 75 MCG PO TABS: 75.0000 ug | ORAL_TABLET | Freq: Every day | ORAL | Status: DC

## 2016-02-07 ENCOUNTER — Other Ambulatory Visit: Payer: Self-pay | Admitting: Family Medicine

## 2016-02-08 ENCOUNTER — Encounter: Payer: Self-pay | Admitting: Gastroenterology

## 2016-07-21 ENCOUNTER — Ambulatory Visit (INDEPENDENT_AMBULATORY_CARE_PROVIDER_SITE_OTHER): Payer: BLUE CROSS/BLUE SHIELD | Admitting: Family Medicine

## 2016-07-21 ENCOUNTER — Encounter: Payer: Self-pay | Admitting: Family Medicine

## 2016-07-21 VITALS — BP 114/68 | HR 87 | Temp 98.8°F | Resp 14 | Wt 163.0 lb

## 2016-07-21 DIAGNOSIS — E039 Hypothyroidism, unspecified: Secondary | ICD-10-CM | POA: Diagnosis not present

## 2016-07-21 DIAGNOSIS — E785 Hyperlipidemia, unspecified: Secondary | ICD-10-CM | POA: Diagnosis not present

## 2016-07-21 DIAGNOSIS — Z808 Family history of malignant neoplasm of other organs or systems: Secondary | ICD-10-CM

## 2016-07-21 DIAGNOSIS — E538 Deficiency of other specified B group vitamins: Secondary | ICD-10-CM | POA: Diagnosis not present

## 2016-07-21 MED ORDER — CYANOCOBALAMIN 1000 MCG/ML IJ SOLN: 1000.0000 ug | Freq: Once | INTRAMUSCULAR | Status: DC

## 2016-07-21 NOTE — Assessment & Plan Note (Addendum)
Refer to dermatologist for monitoring with fair skin, hx of sunburns, positive family hx of melanoma

## 2016-07-21 NOTE — Assessment & Plan Note (Addendum)
Check TSH today; if normal, then okay to check once a year

## 2016-07-21 NOTE — Progress Notes (Signed)
BP 114/68   Pulse 87   Temp 98.8 F (37.1 C) (Oral)   Resp 14   Wt 163 lb (73.9 kg)   SpO2 99%   BMI 27.98 kg/m    Subjective:    Patient ID: Lynn Sandoval, female    DOB: 01-22-1951, 65 y.o.   MRN: [id number]  HPI: Lynn Sandoval is a 65 y.o. female  Chief Complaint  Patient presents with  . Follow-up   Patient is new to me; her previous provider left the office  She has hypothyroidism; her last doctor increased her dose from 50 mcg to 75 mcg daily; that happened last fall; last check was January; no surgery or radiation; that is hereditary too, mother had surgery on thyroid in her 31's; grandmother had thyroid problems Lab Results  Component Value Date   TSH 2.050 01/23/2016  that was drawn months after the change from 50 mg to 75 mcg daily  High cholesterol; patient used to take statin, but doctor took her off; no problems, no muscle aches, no liver enzyme problems; does eat some fatty meats; maybe once a week, bacon and eggs on Saturday  Lab Results  Component Value Date   CHOL 283 (H) 01/23/2016   CHOL 301 (H) 08/08/2015   Lab Results  Component Value Date   HDL 73 01/23/2016   HDL 74 08/08/2015   Lab Results  Component Value Date   LDLCALC 191 (H) 01/23/2016   LDLCALC 211 (H) 08/08/2015   Lab Results  Component Value Date   TRIG 94 01/23/2016   TRIG 81 08/08/2015   Lab Results  Component Value Date   CHOLHDL 3.9 01/23/2016   CHOLHDL 4.1 08/08/2015   No results found for: LDLDIRECT  Her father has heart issues; he had a heart attack 20 years ago; he is still living; her mother is on cholesterol medicine She does not take an aspirin daily  She has fair skin and mother has had melanomas; would like referral to dermatologist  Depression screen [facility] 2/9 07/21/2016 01/21/2016 08/07/2015  Decreased Interest 0 0 1  Down, Depressed, Hopeless 0 1 1  PHQ - 2 Score 0 1 2  Altered sleeping - - 3  Tired, decreased energy - - 2  Change in appetite - - 0  Feeling  bad or failure about yourself  - - 1  Trouble concentrating - - 1  Moving slowly or fidgety/restless - - 0  Suicidal thoughts - - 0  PHQ-9 Score - - 9  Difficult doing work/chores - - Not difficult at all   Relevant past medical, surgical, family and social history reviewed Past Medical History:  Diagnosis Date  . Arthritis    fingers  . Colon polyps   . Family history of adverse reaction to anesthesia    son - slow to wake  . Hyperlipidemia   . Hypothyroidism   . Motion sickness    boats  . Thyroid disease    Past Surgical History:  Procedure Laterality Date  . [place]  . COLONOSCOPY WITH PROPOFOL N/A 02/04/2016   Procedure: COLONOSCOPY WITH PROPOFOL;  Surgeon: Lucilla Lame, MD;  Location: [place];  Service: Endoscopy;  Laterality: N/A;  . EYE SURGERY  1955  . POLYPECTOMY  02/04/2016   Procedure: POLYPECTOMY;  Surgeon: Lucilla Lame, MD;  Location: [place];  Service: Endoscopy;;  . TONSILLECTOMY  1960  . TONSILLECTOMY     Family History  Problem Relation Age of  Onset  . Depression Mother   . Hearing loss Mother   . Hyperlipidemia Mother   . Hypertension Mother   . Glaucoma Mother   . Cancer Mother     melanoma  . Heart disease Father   . Heart attack Father   . Diabetes Maternal Grandmother   . Cancer Paternal Grandmother     colon   Social History  Substance Use Topics  . Smoking status: Never Smoker  . Smokeless tobacco: Not on file  . Alcohol use 1.8 oz/week    3 Glasses of wine per week     Comment: ocassional glass of wine   Interim medical history since last visit reviewed. Allergies and medications reviewed  Review of Systems  Cardiovascular: Negative for chest pain and palpitations.  Musculoskeletal: Negative for myalgias.   Per HPI unless specifically indicated above     Objective:    BP 114/68   Pulse 87   Temp 98.8 F (37.1 C) (Oral)   Resp 14   Wt 163 lb (73.9 kg)   SpO2 99%   BMI 27.98  kg/m   Wt Readings from Last 3 Encounters:  07/21/16 163 lb (73.9 kg)  02/04/16 161 lb (73 kg)  01/21/16 167 lb (75.8 kg)    Physical Exam  Constitutional: She appears well-developed and well-nourished. No distress.  HENT:  Head: Normocephalic and atraumatic.  Eyes: EOM are normal. No scleral icterus.  Neck: No thyromegaly present.  Cardiovascular: Normal rate, regular rhythm and normal heart sounds.   No murmur heard. Pulmonary/Chest: Effort normal and breath sounds normal. No respiratory distress. She has no wheezes.  Abdominal: Soft. Bowel sounds are normal. She exhibits no distension.  Musculoskeletal: Normal range of motion. She exhibits no edema.  Neurological: She is alert.  Skin: Skin is warm and dry. She is not diaphoretic. No pallor.  Psychiatric: She has a normal mood and affect. Her behavior is normal. Judgment and thought content normal.   Results for orders placed or performed in visit on 01/21/16  CBC with Differential/Platelet  Result Value Ref Range   WBC 6.0 3.4 - 10.8 x10E3/uL   RBC 5.07 3.77 - 5.28 x10E6/uL   Hemoglobin 14.5 11.1 - 15.9 g/dL   Hematocrit 42.9 34.0 - 46.6 %   MCV 85 79 - 97 fL   MCH 28.6 26.6 - 33.0 pg   MCHC 33.8 31.5 - 35.7 g/dL   RDW 13.8 12.3 - 15.4 %   Platelets 283 150 - 379 x10E3/uL   Neutrophils 55 %   Lymphs 34 %   Monocytes 7 %   Eos 3 %   Basos 1 %   Neutrophils Absolute 3.3 1.4 - 7.0 x10E3/uL   Lymphocytes Absolute 2.1 0.7 - 3.1 x10E3/uL   Monocytes Absolute 0.4 0.1 - 0.9 x10E3/uL   EOS (ABSOLUTE) 0.2 0.0 - 0.4 x10E3/uL   Basophils Absolute 0.0 0.0 - 0.2 x10E3/uL   Immature Granulocytes 0 %   Immature Grans (Abs) 0.0 0.0 - 0.1 x10E3/uL  Comprehensive metabolic panel  Result Value Ref Range   Glucose 95 65 - 99 mg/dL   BUN 16 8 - 27 mg/dL   Creatinine, Ser 0.78 0.57 - 1.00 mg/dL   GFR calc non Af Amer 81 >59 mL/min/1.73   GFR calc Af Amer 94 >59 mL/min/1.73   BUN/Creatinine Ratio 21 11 - 26   Sodium 141 134 - 144  mmol/L   Potassium 4.5 3.5 - 5.2 mmol/L   Chloride 100 96 - 106  mmol/L   CO2 24 18 - 29 mmol/L   Calcium 8.9 8.7 - 10.3 mg/dL   Total Protein 6.6 6.0 - 8.5 g/dL   Albumin 4.5 3.6 - 4.8 g/dL   Globulin, Total 2.1 1.5 - 4.5 g/dL   Albumin/Globulin Ratio 2.1 1.1 - 2.5   Bilirubin Total 0.4 0.0 - 1.2 mg/dL   Alkaline Phosphatase 103 39 - 117 IU/L   AST 18 0 - 40 IU/L   ALT 21 0 - 32 IU/L  Lipid panel  Result Value Ref Range   Cholesterol, Total 283 (H) 100 - 199 mg/dL   Triglycerides 94 0 - 149 mg/dL   HDL 73 >39 mg/dL   VLDL Cholesterol Cal 19 5 - 40 mg/dL   LDL Calculated 191 (H) 0 - 99 mg/dL   Comment: Comment    Chol/HDL Ratio 3.9 0.0 - 4.4 ratio units  TSH  Result Value Ref Range   TSH 2.050 0.450 - 4.500 uIU/mL  T3, free  Result Value Ref Range   T3, Free 3.4 2.0 - 4.4 pg/mL  T4, free  Result Value Ref Range   Free T4 1.70 0.82 - 1.77 ng/dL  HIV antibody  Result Value Ref Range   HIV Screen 4th Generation wRfx Non Reactive Non Reactive  Hepatitis C antibody  Result Value Ref Range   Hep C Virus Ab 0.9 0.0 - 0.9 s/co ratio  Pap IG w/ reflex to HPV when ASC-U  Result Value Ref Range   DIAGNOSIS: Comment    Specimen adequacy: Comment    CLINICIAN PROVIDED ICD10: Comment    Performed by: Comment    PAP SMEAR COMMENT .    Note: Comment    Test Methodology Comment    PAP REFLEX: Comment       Assessment & Plan:   Problem List Items Addressed This Visit      Endocrine   Hypothyroidism, adult    Check TSH today; if normal, then okay to check once a year      Relevant Orders   TSH     Other   Hyperlipidemia LDL goal <100    check fasting lipids; limit saturated fats; see AVS; I have no doubt this is inherited; we ran the 10 year ASCVD risk calculator and even with her elevated LDL, she is less than 5% risk of event in the next 10 years (her number is 4.3%)      Relevant Orders   Lipid panel   Family hx of melanoma    Refer to dermatologist for monitoring  with fair skin, hx of sunburns, positive family hx of melanoma      Relevant Orders   Ambulatory referral to Dermatology    Other Visit Diagnoses   None.     Follow up plan: Return in about 7 months (around 02/16/2017) for Welcome to Medicare visit, 40 minutes.  An after-visit summary was printed and given to the patient at [facility].  Please see the patient instructions which may contain other information and recommendations beyond what is mentioned above in the assessment and plan.  No orders of the defined types were placed in this encounter.  Orders Placed This Encounter  Procedures  . Lipid panel  . TSH  . Ambulatory referral to Dermatology

## 2016-07-21 NOTE — Assessment & Plan Note (Addendum)
check fasting lipids; limit saturated fats; see AVS; I have no doubt this is inherited; we ran the 10 year ASCVD risk calculator and even with her elevated LDL, she is less than 5% risk of event in the next 10 years (her number is 4.3%)

## 2016-07-21 NOTE — Patient Instructions (Addendum)
Try to limit saturated fats in your diet (bologna, hot dogs, barbeque, cheeseburgers, hamburgers, steak, bacon, sausage, cheese, etc.) and get more fresh fruits, vegetables, and whole grains  We'll have you return for fasting labs in the next week or so I've entered the referral to the dermatologist  If you have not heard anything from my staff in a week about any orders/referrals/studies from today, please contact us here to follow-up [phone]  Cholesterol Cholesterol is a white, waxy, fat-like substance needed by your body in small amounts. The liver makes all the cholesterol you need. Cholesterol is carried from the liver by the blood through the blood vessels. Deposits of cholesterol (plaque) may build up on blood vessel walls. These make the arteries narrower and stiffer. Cholesterol plaques increase the risk for heart attack and stroke.  You cannot feel your cholesterol level even if it is very high. The only way to know it is high is with a blood test. Once you know your cholesterol levels, you should keep a record of the test results. Work with your health care provider to keep your levels in the desired range.  WHAT DO THE RESULTS MEAN?  Total cholesterol is a rough measure of all the cholesterol in your blood.   LDL is the so-called bad cholesterol. This is the type that deposits cholesterol in the walls of the arteries. You want this level to be low.   HDL is the good cholesterol because it cleans the arteries and carries the LDL away. You want this level to be high.  Triglycerides are fat that the body can either burn for energy or store. High levels are closely linked to heart disease.  WHAT ARE THE DESIRED LEVELS OF CHOLESTEROL?  Total cholesterol below 200.   LDL below 100 for people at risk, below 70 for those at very high risk.   HDL above 50 is good, above 60 is best.   Triglycerides below 150.  HOW CAN I LOWER MY CHOLESTEROL?  Diet. Follow your diet  programs as directed by your health care provider.   Choose fish or white meat chicken and [place], roasted or baked. Limit fatty cuts of red meat, fried foods, and processed meats, such as sausage and lunch meats.   Eat lots of fresh fruits and vegetables.  Choose whole grains, beans, pasta, potatoes, and cereals.   Use only small amounts of olive, corn, or canola oils.   Avoid butter, mayonnaise, shortening, or palm kernel oils.  Avoid foods with trans fats.   Drink skim or nonfat milk and eat low-fat or nonfat yogurt and cheeses. Avoid whole milk, cream, ice cream, egg yolks, and full-fat cheeses.   Healthy desserts include angel food cake, ginger snaps, animal crackers, hard candy, popsicles, and low-fat or nonfat frozen yogurt. Avoid pastries, cakes, pies, and cookies.   Exercise. Follow your exercise programs as directed by your health care provider.   A regular program helps decrease LDL and raise HDL.   A regular program helps with weight control.   Do things that increase your activity level like gardening, walking, or taking the stairs. Ask your health care provider about how you can be more active in your daily life.   Medicine. Take medicine only as directed by your health care provider.   Medicine may be prescribed by your health care provider to help lower cholesterol and decrease the risk for heart disease.   If you have several risk factors, you may need medicine even if  your levels are normal.   This information is not intended to replace advice given to you by your health care provider. Make sure you discuss any questions you have with your health care provider.   Document Released: 09/09/2001 Document Revised: 01/05/2015 Document Reviewed: 09/28/2013 Elsevier Interactive Patient Education [employer].

## 2016-07-22 ENCOUNTER — Other Ambulatory Visit: Payer: Self-pay | Admitting: Emergency Medicine

## 2016-07-22 DIAGNOSIS — E039 Hypothyroidism, unspecified: Secondary | ICD-10-CM

## 2016-07-22 DIAGNOSIS — E785 Hyperlipidemia, unspecified: Secondary | ICD-10-CM

## 2016-07-22 LAB — LIPID PANEL
Cholesterol: 309 mg/dL — ABNORMAL HIGH (ref 125–200)
HDL: 81 mg/dL (ref >=46)
LDL Cholesterol: 209 mg/dL — ABNORMAL HIGH (ref <130)
Total CHOL/HDL Ratio: 3.8 Ratio (ref <=5.0)
Triglycerides: 95 mg/dL (ref <150)
VLDL: 19 mg/dL (ref <30)

## 2016-07-22 LAB — TSH: TSH: 2.09 mIU/L

## 2016-07-23 ENCOUNTER — Other Ambulatory Visit: Payer: Self-pay

## 2016-07-23 DIAGNOSIS — E039 Hypothyroidism, unspecified: Secondary | ICD-10-CM

## 2016-07-23 MED ORDER — LEVOTHYROXINE SODIUM 75 MCG PO TABS: 75.0000 ug | ORAL_TABLET | Freq: Every day | ORAL | 3 refills | Status: DC

## 2016-07-23 NOTE — Telephone Encounter (Signed)
Normal TSH yesterday; Rx approved for one year

## 2016-07-30 ENCOUNTER — Encounter: Payer: Self-pay | Admitting: Family Medicine

## 2016-07-30 DIAGNOSIS — Z5181 Encounter for therapeutic drug level monitoring: Secondary | ICD-10-CM

## 2016-07-30 DIAGNOSIS — E039 Hypothyroidism, unspecified: Secondary | ICD-10-CM

## 2016-07-30 DIAGNOSIS — E785 Hyperlipidemia, unspecified: Secondary | ICD-10-CM

## 2016-07-31 DIAGNOSIS — Z5181 Encounter for therapeutic drug level monitoring: Secondary | ICD-10-CM | POA: Insufficient documentation

## 2016-07-31 MED ORDER — ATORVASTATIN CALCIUM 20 MG PO TABS: 20.0000 mg | ORAL_TABLET | Freq: Every day | ORAL | 0 refills | Status: DC

## 2016-07-31 MED ORDER — LEVOTHYROXINE SODIUM 75 MCG PO TABS: 75.0000 ug | ORAL_TABLET | Freq: Every day | ORAL | 3 refills | Status: DC

## 2016-07-31 NOTE — Assessment & Plan Note (Signed)
Check sgpt in 6-8 weeks 

## 2016-07-31 NOTE — Assessment & Plan Note (Signed)
Start statin; recheck labs in 6-8 weeks 

## 2016-09-23 ENCOUNTER — Other Ambulatory Visit: Payer: Self-pay | Admitting: Family Medicine

## 2016-09-23 DIAGNOSIS — Z5181 Encounter for therapeutic drug level monitoring: Secondary | ICD-10-CM

## 2016-09-23 DIAGNOSIS — E785 Hyperlipidemia, unspecified: Secondary | ICD-10-CM

## 2016-09-23 LAB — LIPID PANEL
Cholesterol: 183 mg/dL (ref 125–200)
HDL: 80 mg/dL (ref >=46)
LDL Cholesterol: 91 mg/dL (ref <130)
Total CHOL/HDL Ratio: 2.3 Ratio (ref <=5.0)
Triglycerides: 58 mg/dL (ref <150)
VLDL: 12 mg/dL (ref <30)

## 2016-09-23 LAB — ALT: ALT: 22 U/L (ref 6–29)

## 2016-09-24 ENCOUNTER — Other Ambulatory Visit: Payer: Self-pay | Admitting: Family Medicine

## 2016-09-24 MED ORDER — ATORVASTATIN CALCIUM 20 MG PO TABS: 20.0000 mg | ORAL_TABLET | Freq: Every day | ORAL | 3 refills | Status: DC

## 2016-09-24 NOTE — Progress Notes (Signed)
Lipids and sgpt wonderful; continue Rx

## 2016-10-18 ENCOUNTER — Other Ambulatory Visit: Payer: Self-pay | Admitting: Family Medicine

## 2016-10-20 NOTE — Telephone Encounter (Signed)
Confirmed with pharmacy.

## 2016-10-20 NOTE — Telephone Encounter (Signed)
I approved an entire year's supply of atorvastatin on 09/24/16 to [facility] Please resolve with pharmacy

## 2016-10-27 DIAGNOSIS — D239 Other benign neoplasm of skin, unspecified: Secondary | ICD-10-CM

## 2016-10-27 HISTORY — DX: Other benign neoplasm of skin, unspecified: D23.9

## 2016-11-11 ENCOUNTER — Encounter: Payer: Self-pay | Admitting: Family Medicine

## 2016-12-23 ENCOUNTER — Other Ambulatory Visit: Payer: Self-pay | Admitting: Family Medicine

## 2016-12-23 DIAGNOSIS — E039 Hypothyroidism, unspecified: Secondary | ICD-10-CM

## 2016-12-23 NOTE — Telephone Encounter (Signed)
Patient requesting refill of Atorvastatin and Levothyroxine to [facility].

## 2016-12-23 NOTE — Telephone Encounter (Signed)
Request received for thyroid and chol med Labs from July and Sept reviewed I approved a year of thyroid medicine on 07/31/16 and that was confirmed by pharmacy, [facility]; please resolve with them I approved a year of cholesterol medicine on 09/24/16 and that was confirmed by [facility] as well She shouldn't need refills from me; please contact pharmacy and resolve both of these

## 2016-12-24 ENCOUNTER — Other Ambulatory Visit: Payer: Self-pay

## 2016-12-24 NOTE — Telephone Encounter (Signed)
Pharmacist states she does not know why we are receiving these request due to atorvastatin she has 3 refills and the synthroid she still has 2 refills.

## 2017-01-19 ENCOUNTER — Other Ambulatory Visit: Payer: Self-pay | Admitting: Family Medicine

## 2017-01-19 DIAGNOSIS — Z1231 Encounter for screening mammogram for malignant neoplasm of breast: Secondary | ICD-10-CM

## 2017-01-29 ENCOUNTER — Telehealth: Payer: Self-pay | Admitting: Family Medicine

## 2017-01-29 DIAGNOSIS — E039 Hypothyroidism, unspecified: Secondary | ICD-10-CM

## 2017-01-29 MED ORDER — LEVOTHYROXINE SODIUM 75 MCG PO TABS: 75.0000 ug | ORAL_TABLET | Freq: Every day | ORAL | 3 refills | Status: DC

## 2017-01-29 NOTE — Assessment & Plan Note (Signed)
Check TSH 6-8 weeks after med change

## 2017-01-29 NOTE — Telephone Encounter (Signed)
That's fine Any time we change the manufacturer of a thyroid medicine, we should recheck the TSH 6-8 weeks later because of a very narrow therapeutic window; I've entered new order and sent Rx again (she'll want to contact pharmacy to make sure they send generic)

## 2017-01-29 NOTE — Telephone Encounter (Signed)
Pt would like to change her mail order RX Synthroid to the generic. Please advise.

## 2017-01-30 ENCOUNTER — Encounter: Payer: Self-pay | Admitting: Family Medicine

## 2017-01-30 DIAGNOSIS — E039 Hypothyroidism, unspecified: Secondary | ICD-10-CM

## 2017-01-30 MED ORDER — LEVOTHYROXINE SODIUM 75 MCG PO TABS: 75.0000 ug | ORAL_TABLET | Freq: Every day | ORAL | 3 refills | Status: DC

## 2017-01-30 MED ORDER — ATORVASTATIN CALCIUM 20 MG PO TABS
20.0000 mg | ORAL_TABLET | Freq: Every day | ORAL | 3 refills | Status: DC
Start: 2017-01-30 — End: 2018-01-01

## 2017-02-12 ENCOUNTER — Ambulatory Visit
Admission: RE | Admit: 2017-02-12 | Discharge: 2017-02-12 | Disposition: A | Discharge status: Home / Self Care | Payer: Medicare Other | Source: Ambulatory Visit | Attending: Family Medicine | Admitting: Family Medicine

## 2017-02-12 DIAGNOSIS — Z1231 Encounter for screening mammogram for malignant neoplasm of breast: Secondary | ICD-10-CM | POA: Diagnosis not present

## 2017-02-13 ENCOUNTER — Encounter: Payer: Self-pay | Admitting: Family Medicine

## 2017-02-13 ENCOUNTER — Ambulatory Visit (INDEPENDENT_AMBULATORY_CARE_PROVIDER_SITE_OTHER): Payer: Medicare Other | Admitting: Family Medicine

## 2017-02-13 VITALS — BP 118/72 | HR 95 | Temp 98.6°F | Resp 14 | Wt 161.6 lb

## 2017-02-13 DIAGNOSIS — E785 Hyperlipidemia, unspecified: Secondary | ICD-10-CM

## 2017-02-13 DIAGNOSIS — I517 Cardiomegaly: Secondary | ICD-10-CM | POA: Insufficient documentation

## 2017-02-13 DIAGNOSIS — Z5181 Encounter for therapeutic drug level monitoring: Secondary | ICD-10-CM

## 2017-02-13 DIAGNOSIS — Z0000 Encounter for general adult medical examination without abnormal findings: Secondary | ICD-10-CM | POA: Diagnosis not present

## 2017-02-13 DIAGNOSIS — Z1159 Encounter for screening for other viral diseases: Secondary | ICD-10-CM | POA: Diagnosis not present

## 2017-02-13 DIAGNOSIS — E039 Hypothyroidism, unspecified: Secondary | ICD-10-CM

## 2017-02-13 DIAGNOSIS — Z86018 Personal history of other benign neoplasm: Secondary | ICD-10-CM

## 2017-02-13 DIAGNOSIS — D122 Benign neoplasm of ascending colon: Secondary | ICD-10-CM

## 2017-02-13 DIAGNOSIS — Z808 Family history of malignant neoplasm of other organs or systems: Secondary | ICD-10-CM

## 2017-02-13 DIAGNOSIS — Z780 Asymptomatic menopausal state: Secondary | ICD-10-CM

## 2017-02-13 DIAGNOSIS — Z23 Encounter for immunization: Secondary | ICD-10-CM

## 2017-02-13 HISTORY — DX: Personal history of other benign neoplasm: Z86.018

## 2017-02-13 NOTE — Assessment & Plan Note (Signed)
Suspect mitral regurgitation based on EKG findings; will get ECHO

## 2017-02-13 NOTE — Assessment & Plan Note (Signed)
Seeing dermatologist 

## 2017-02-13 NOTE — Assessment & Plan Note (Signed)
Followed by dermatologist

## 2017-02-13 NOTE — Patient Instructions (Addendum)
I'll recommend taking 1,000 iu of vitamin D3 daily Avoid grapefruit Orange a day or 500 mg of vitamin C daily during flu and cold season Return for fasting labs on or after March 24, 2017, at long as you've been on the thyroid medicine for at least six weeks, any time after March 27th is fine Health Maintenance  Topic Date Due  . TETANUS/TDAP  02/13/2017 (Originally 12/29/2013)  . PNA vac Low Risk Adult (2 of 2 - PPSV23) 02/13/2018  . PAP SMEAR  01/21/2019  . MAMMOGRAM  02/12/2019  . COLONOSCOPY  02/03/2021  . INFLUENZA VACCINE  Completed  . DEXA SCAN  Completed  . ZOSTAVAX  Completed  . Hepatitis C Screening  Completed  . HIV Screening  Completed   Paps are done Mammogram in Feb 2019 Please do call to schedule your bone density study; the number to schedule one at either [facility] or [facility] is [phone] Return for hep C rescreen with other labs  Health Maintenance, Female Introduction Adopting a healthy lifestyle and getting preventive care can go a long way to promote health and wellness. Talk with your health care provider about what schedule of regular examinations is right for you. This is a good chance for you to check in with your provider about disease prevention and staying healthy. In between checkups, there are plenty of things you can do on your own. Experts have done a lot of research about which lifestyle changes and preventive measures are most likely to keep you healthy. Ask your health care provider for more information. Weight and diet Eat a healthy diet  Be sure to include plenty of vegetables, fruits, low-fat dairy products, and lean protein.  Do not eat a lot of foods high in solid fats, added sugars, or salt.  Get regular exercise. This is one of the most important things you can do for your health.  Most adults should exercise for at least 150 minutes each week. The exercise should increase your heart rate and make you  sweat (moderate-intensity exercise).  Most adults should also do strengthening exercises at least twice a week. This is in addition to the moderate-intensity exercise. Maintain a healthy weight  Body mass index (BMI) is a measurement that can be used to identify possible weight problems. It estimates body fat based on height and weight. Your health care provider can help determine your BMI and help you achieve or maintain a healthy weight.  For females 30 years of age and older:  A BMI below 18.5 is considered underweight.  A BMI of 18.5 to 24.9 is normal.  A BMI of 25 to 29.9 is considered overweight.  A BMI of 30 and above is considered obese. Watch levels of cholesterol and blood lipids  You should start having your blood tested for lipids and cholesterol at 65 years of age, then have this test every 5 years.  You may need to have your cholesterol levels checked more often if:  Your lipid or cholesterol levels are high.  You are older than 65 years of age.  You are at high risk for heart disease. Cancer screening Lung Cancer  Lung cancer screening is recommended for adults 39-62 years old who are at high risk for lung cancer because of a history of smoking.  A yearly low-dose CT scan of the lungs is recommended for people who:  Currently smoke.  Have quit within the past 15 years.  Have at least a 30-pack-year  history of smoking. A pack year is smoking an average of one pack of cigarettes a day for 1 year.  Yearly screening should continue until it has been 15 years since you quit.  Yearly screening should stop if you develop a health problem that would prevent you from having lung cancer treatment. Breast Cancer  Practice breast self-awareness. This means understanding how your breasts normally appear and feel.  It also means doing regular breast self-exams. Let your health care provider know about any changes, no matter how small.  If you are in your 20s or 30s,  you should have a clinical breast exam (CBE) by a health care provider every 1-3 years as part of a regular health exam.  If you are 15 or older, have a CBE every year. Also consider having a breast X-ray (mammogram) every year.  If you have a family history of breast cancer, talk to your health care provider about genetic screening.  If you are at high risk for breast cancer, talk to your health care provider about having an MRI and a mammogram every year.  Breast cancer gene (BRCA) assessment is recommended for women who have family members with BRCA-related cancers. BRCA-related cancers include:  Breast.  Ovarian.  Tubal.  Peritoneal cancers.  Results of the assessment will determine the need for genetic counseling and BRCA1 and BRCA2 testing. Cervical Cancer  Your health care provider may recommend that you be screened regularly for cancer of the pelvic organs (ovaries, uterus, and vagina). This screening involves a pelvic examination, including checking for microscopic changes to the surface of your cervix (Pap test). You may be encouraged to have this screening done every 3 years, beginning at age 68.  For women ages 82-65, health care providers may recommend pelvic exams and Pap testing every 3 years, or they may recommend the Pap and pelvic exam, combined with testing for human papilloma virus (HPV), every 5 years. Some types of HPV increase your risk of cervical cancer. Testing for HPV may also be done on women of any age with unclear Pap test results.  Other health care providers may not recommend any screening for nonpregnant women who are considered low risk for pelvic cancer and who do not have symptoms. Ask your health care provider if a screening pelvic exam is right for you.  If you have had past treatment for cervical cancer or a condition that could lead to cancer, you need Pap tests and screening for cancer for at least 20 years after your treatment. If Pap tests have  been discontinued, your risk factors (such as having a new sexual partner) need to be reassessed to determine if screening should resume. Some women have medical problems that increase the chance of getting cervical cancer. In these cases, your health care provider may recommend more frequent screening and Pap tests. Colorectal Cancer  This type of cancer can be detected and often prevented.  Routine colorectal cancer screening usually begins at 65 years of age and continues through 65 years of age.  Your health care provider may recommend screening at an earlier age if you have risk factors for colon cancer.  Your health care provider may also recommend using home test kits to check for hidden blood in the stool.  A small camera at the end of a tube can be used to examine your colon directly (sigmoidoscopy or colonoscopy). This is done to check for the earliest forms of colorectal cancer.  Routine screening usually begins at  age 34.  Direct examination of the colon should be repeated every 5-10 years through 65 years of age. However, you may need to be screened more often if early forms of precancerous polyps or small growths are found. Skin Cancer  Check your skin from head to toe regularly.  Tell your health care provider about any new moles or changes in moles, especially if there is a change in a mole's shape or color.  Also tell your health care provider if you have a mole that is larger than the size of a pencil eraser.  Always use sunscreen. Apply sunscreen liberally and repeatedly throughout the day.  Protect yourself by wearing long sleeves, pants, a wide-brimmed hat, and sunglasses whenever you are outside. Heart disease, diabetes, and high blood pressure  High blood pressure causes heart disease and increases the risk of stroke. High blood pressure is more likely to develop in:  People who have blood pressure in the high end of the normal range (130-139/85-89 mm  Hg).  People who are overweight or obese.  People who are African American.  If you are 68-1 years of age, have your blood pressure checked every 3-5 years. If you are 73 years of age or older, have your blood pressure checked every year. You should have your blood pressure measured twice-once when you are at a hospital or clinic, and once when you are not at a hospital or clinic. Record the average of the two measurements. To check your blood pressure when you are not at a hospital or clinic, you can use:  An automated blood pressure machine at a pharmacy.  A home blood pressure monitor.  If you are between 65 years and 43 years old, ask your health care provider if you should take aspirin to prevent strokes.  Have regular diabetes screenings. This involves taking a blood sample to check your fasting blood sugar level.  If you are at a normal weight and have a low risk for diabetes, have this test once every three years after 65 years of age.  If you are overweight and have a high risk for diabetes, consider being tested at a younger age or more often. Preventing infection Hepatitis B  If you have a higher risk for hepatitis B, you should be screened for this virus. You are considered at high risk for hepatitis B if:  You were born in a country where hepatitis B is common. Ask your health care provider which countries are considered high risk.  Your parents were born in a high-risk country, and you have not been immunized against hepatitis B (hepatitis B vaccine).  You have HIV or AIDS.  You use needles to inject street drugs.  You live with someone who has hepatitis B.  You have had sex with someone who has hepatitis B.  You get hemodialysis treatment.  You take certain medicines for conditions, including cancer, organ transplantation, and autoimmune conditions. Hepatitis C  Blood testing is recommended for:  Everyone born from [birth year] through 1964.  Anyone with known  risk factors for hepatitis C. Sexually transmitted infections (STIs)  You should be screened for sexually transmitted infections (STIs) including gonorrhea and chlamydia if:  You are sexually active and are younger than 65 years of age.  You are older than 65 years of age and your health care provider tells you that you are at risk for this type of infection.  Your sexual activity has changed since you were last screened and you  are at an increased risk for chlamydia or gonorrhea. Ask your health care provider if you are at risk.  If you do not have HIV, but are at risk, it may be recommended that you take a prescription medicine daily to prevent HIV infection. This is called pre-exposure prophylaxis (PrEP). You are considered at risk if:  You are sexually active and do not regularly use condoms or know the HIV status of your partner(s).  You take drugs by injection.  You are sexually active with a partner who has HIV. Talk with your health care provider about whether you are at high risk of being infected with HIV. If you choose to begin PrEP, you should first be tested for HIV. You should then be tested every 3 months for as long as you are taking PrEP. Pregnancy  If you are premenopausal and you may become pregnant, ask your health care provider about preconception counseling.  If you may become pregnant, take 400 to 800 micrograms (mcg) of folic acid every day.  If you want to prevent pregnancy, talk to your health care provider about birth control (contraception). Osteoporosis and menopause  Osteoporosis is a disease in which the bones lose minerals and strength with aging. This can result in serious bone fractures. Your risk for osteoporosis can be identified using a bone density scan.  If you are 81 years of age or older, or if you are at risk for osteoporosis and fractures, ask your health care provider if you should be screened.  Ask your health care provider whether you  should take a calcium or vitamin D supplement to lower your risk for osteoporosis.  Menopause may have certain physical symptoms and risks.  Hormone replacement therapy may reduce some of these symptoms and risks. Talk to your health care provider about whether hormone replacement therapy is right for you. Follow these instructions at home:  Schedule regular health, dental, and eye exams.  Stay current with your immunizations.  Do not use any tobacco products including cigarettes, chewing tobacco, or electronic cigarettes.  If you are pregnant, do not drink alcohol.  If you are breastfeeding, limit how much and how often you drink alcohol.  Limit alcohol intake to no more than 1 drink per day for nonpregnant women. One drink equals 12 ounces of beer, 5 ounces of wine, or 1 ounces of hard liquor.  Do not use street drugs.  Do not share needles.  Ask your health care provider for help if you need support or information about quitting drugs.  Tell your health care provider if you often feel depressed.  Tell your health care provider if you have ever been abused or do not feel safe at home. This information is not intended to replace advice given to you by your health care provider. Make sure you discuss any questions you have with your health care provider. Document Released: 06/30/2011 Document Revised: 05/22/2016 Document Reviewed: 09/18/2015  2017 [employer]

## 2017-02-13 NOTE — Assessment & Plan Note (Signed)
LDL was over 200; great response to statin; recheck lipids (fasting) in late March or April

## 2017-02-13 NOTE — Assessment & Plan Note (Signed)
indeteminite test 2017; recheck

## 2017-02-13 NOTE — Assessment & Plan Note (Signed)
Welcome to Medicare visit done today; see AVS

## 2017-02-13 NOTE — Progress Notes (Signed)
Patient: Lynn Sandoval, Female    DOB: Jun 07, 1951, 66 y.o.   MRN: [id number]  Visit Date: 02/13/2017  Today's Provider: Enid Derry, MD   Chief Complaint  Patient presents with  . Annual Exam    medicare wellness    Subjective:   Lynn Sandoval is a 66 y.o. female who presents today for her Welcome to [facility] Wellness Visit.  Caregiver input:  N/a  Daughter wanted her to ask about fighting infections in the fall and winter; URI Switched thyroid medicine last Sunday; due for recheck of thyroid after six weeks on new medicines She has high cholesterol; taking statin; had big drop in the LDL Lab Results  Component Value Date   CHOL 183 09/23/2016   CHOL 309 (H) 07/22/2016   CHOL 283 (H) 01/23/2016   Lab Results  Component Value Date   HDL 80 09/23/2016   HDL 81 07/22/2016   HDL 73 01/23/2016   Lab Results  Component Value Date   LDLCALC 91 09/23/2016   LDLCALC 209 (H) 07/22/2016   LDLCALC 191 (H) 01/23/2016   Lab Results  Component Value Date   TRIG 58 09/23/2016   TRIG 95 07/22/2016   TRIG 94 01/23/2016   Lab Results  Component Value Date   CHOLHDL 2.3 09/23/2016   CHOLHDL 3.8 07/22/2016   CHOLHDL 3.9 01/23/2016   No results found for: LDLDIRECT  USPSTF grade A and B recommendations Alcohol: enjoys a wine or beer five times a week Depression:  Depression screen [facility] 2/9 02/13/2017 07/21/2016 01/21/2016 08/07/2015  Decreased Interest 0 0 0 1  Down, Depressed, Hopeless 0 0 1 1  PHQ - 2 Score 0 0 1 2  Altered sleeping - - - 3  Tired, decreased energy - - - 2  Change in appetite - - - 0  Feeling bad or failure about yourself  - - - 1  Trouble concentrating - - - 1  Moving slowly or fidgety/restless - - - 0  Suicidal thoughts - - - 0  PHQ-9 Score - - - 9  Difficult doing work/chores - - - Not difficult at all   Hypertension: excellent Obesity: no Tobacco use: never HIV, hep B, hep C: done 2017 STD testing and prevention (chl/gon/syphilis): declined Lipids:  fantastic drop in the LDL; reviewed Glucose: normal Jan 2017; next due Jan 2020 Colorectal cancer: UTD Breast cancer: UTD, yesterday BRCA gene screening: no breast or ovarian cancer Intimate partner violence: no abuse Cervical cancer screening: last pap smear UTD; now 108, no abnormals ever Lung cancer: n/a Osteoporosis: ordered today Fall prevention/vitamin D: discussed; 1000 iu only AAA: no known family hx Aspirin: doing 81 mg aspirin Diet: not big fish fans, takes fish oil; meat every day one meal; not as many veggies as she should, tries; takes calcium supplement, likes yogurt, likes cheese, not big milk drinker (breakfast is cereal with whole milk, just a little) Exercise: does stay active, walks several times a week, 2 miles or so; gets silver sneakers with health plan; going to gym with her husband, will plan to get some muscle tone Skin cancer: she went to the dermatologist, did remove dysplastic nevus  HPI  Review of Systems  Past Medical History:  Diagnosis Date  . Arthritis    fingers  . Colon polyps   . Family history of adverse reaction to anesthesia    son - slow to wake  . H/O dysplastic nevus 02/13/2017   Followed by Dr. Brendolyn Patty  . Hyperlipidemia   .  Hypothyroidism   . Motion sickness    boats  . Thyroid disease    Past Surgical History:  Procedure Laterality Date  . [place]  . COLONOSCOPY WITH PROPOFOL N/A 02/04/2016   Procedure: COLONOSCOPY WITH PROPOFOL;  Surgeon: Lucilla Lame, MD;  Location: [place];  Service: Endoscopy;  Laterality: N/A;  . EYE SURGERY  1955  . POLYPECTOMY  02/04/2016   Procedure: POLYPECTOMY;  Surgeon: Lucilla Lame, MD;  Location: [place];  Service: Endoscopy;;  . TONSILLECTOMY  1960  . TONSILLECTOMY     Family History  Problem Relation Age of Onset  . Depression Mother   . Hearing loss Mother   . Hyperlipidemia Mother   . Hypertension Mother   . Glaucoma Mother   . Cancer  Mother     melanoma  . Heart disease Father   . Heart attack Father   . Diabetes Maternal Grandmother   . Heart attack Maternal Grandmother   . Cancer Paternal Grandmother     colon  . Breast cancer Neg Hx     Social History   Social History  . Marital status: Married    Spouse name: N/A  . Number of children: 4  . Years of education: N/A   Occupational History  . retired    Social History Main Topics  . Smoking status: Never Smoker  . Smokeless tobacco: Never Used  . Alcohol use 1.8 oz/week    3 Glasses of wine per week     Comment: ocassional glass of wine  . Drug use: No  . Sexual activity: No   Other Topics Concern  . Not on file   Social History Narrative  . No narrative on file    Outpatient Encounter Prescriptions as of 02/13/2017  Medication Sig  . ASPIRIN 81 PO Take 1 tablet by mouth daily.  Marland Kitchen atorvastatin (LIPITOR) 20 MG tablet Take 1 tablet (20 mg total) by mouth at bedtime.  Marland Kitchen CALCIUM PO Take by mouth.  . levothyroxine (SYNTHROID, LEVOTHROID) 75 MCG tablet Take 1 tablet (75 mcg total) by mouth daily before breakfast.  . Omega-3 Fatty Acids (FISH OIL PO) Take by mouth.   No facility-administered encounter medications on file as of 02/13/2017.     Functional Ability / Safety Screening 1.  Was the timed Get Up and Go test longer than 30 seconds?  no 2.  Does the patient need help with the phone, transportation, shopping,      preparing meals, housework, laundry, medications, or managing money?  no 3.  Does the patient's home have:  loose throw rugs in the hallway?   yes      Grab bars in the bathroom? no      Handrails on the stairs?   yes      Poor lighting?   yes 4.  Has the patient noticed any hearing difficulties?   yes, might be turning the tv up a little more; nothing major right now; just a little now  Fall Risk Assessment See under rooming  Depression Screen See under rooming Depression screen [facility] 2/9 02/13/2017 07/21/2016 01/21/2016  08/07/2015  Decreased Interest 0 0 0 1  Down, Depressed, Hopeless 0 0 1 1  PHQ - 2 Score 0 0 1 2  Altered sleeping - - - 3  Tired, decreased energy - - - 2  Change in appetite - - - 0  Feeling bad or failure about yourself  - - - 1  Trouble concentrating - - - 1  Moving slowly or fidgety/restless - - - 0  Suicidal thoughts - - - 0  PHQ-9 Score - - - 9  Difficult doing work/chores - - - Not difficult at all    Advanced Directives Does patient have a HCPOA?    no If yes, name and contact information: n/a Does patient have a living will or MOST form?  no  Objective:   Vitals: BP 118/72   Pulse 95   Temp 98.6 F (37 C) (Oral)   Resp 14   Wt 161 lb 9.6 oz (73.3 kg)   SpO2 96%   BMI 27.74 kg/m  Body mass index is 27.74 kg/m. No exam data present  Physical Exam  Constitutional: She appears well-developed and well-nourished.  HENT:  Head: Normocephalic and atraumatic.  Right Ear: Hearing, tympanic membrane, external ear and ear canal normal.  Left Ear: Hearing, tympanic membrane, external ear and ear canal normal.  Eyes: Conjunctivae and EOM are normal. Right eye exhibits no hordeolum. Left eye exhibits no hordeolum. No scleral icterus.  Neck: Carotid bruit is not present. No thyromegaly present.  Cardiovascular: Normal rate, regular rhythm, S1 normal, S2 normal and normal heart sounds.   No extrasystoles are present.  Pulmonary/Chest: Effort normal and breath sounds normal. No respiratory distress. Right breast exhibits no inverted nipple, no mass, no nipple discharge, no skin change and no tenderness. Left breast exhibits no inverted nipple, no mass, no nipple discharge, no skin change and no tenderness. Breasts are symmetrical.  Abdominal: Soft. Normal appearance and bowel sounds are normal. She exhibits no distension, no abdominal bruit, no pulsatile midline mass and no mass. There is no hepatosplenomegaly. There is no tenderness. No hernia.  Musculoskeletal: Normal range of  motion. She exhibits no edema.  Lymphadenopathy:       Head (right side): No submandibular adenopathy present.       Head (left side): No submandibular adenopathy present.    She has no cervical adenopathy.    She has no axillary adenopathy.  Neurological: She is alert. She displays no tremor. No cranial nerve deficit. She exhibits normal muscle tone. Gait normal.  Reflex Scores:      Patellar reflexes are 2+ on the right side and 2+ on the left side. Skin: Skin is warm and dry. No bruising and no ecchymosis noted. No cyanosis. No pallor.  Few cherry angiomas, as well as light brown lesions on trunk consistent with seborrheic keratoses  Psychiatric: Her speech is normal and behavior is normal. Thought content normal. Her mood appears not anxious. She does not exhibit a depressed mood.   Mood/affect:  euthymic Appearance:  Neatly dressed  Cognitive Testing - 6-CIT  Correct? Score   What year is it? yes 0 Yes = 0    No = 4  What month is it? yes 0 Yes = 0    No = 3  Remember:     [address], [place], [place]     What time is it? yes 0 Yes = 0    No = 3  Count backwards from 20 to 1 yes 0 Correct = 0    1 error = 2   More than 1 error = 4  Say the months of the year in reverse. yes 0 Correct = 0    1 error = 2   More than 1 error = 4  What address did I ask you to remember? yes 0  Correct = 0  1 error = 2    2 error = 4    3 error = 6    4 error = 8    All wrong = 10       TOTAL SCORE  0/28   Interpretation:  Normal  Normal (0-7) Abnormal (8-28)    Assessment & Plan:     Annual Wellness Visit  Reviewed patient's Family Medical History Reviewed and updated list of patient's medical providers Assessment of cognitive impairment was done Assessed patient's functional ability Established a written schedule for health screening [place] Completed and Reviewed  Exercise Activities and Dietary recommendations Goals    None    she will try to get to  the gym, build up activity  Immunization History  Administered Date(s) Administered  . Influenza,inj,Quad PF,36+ Mos 01/21/2016  . Pneumococcal Conjugate-13 02/13/2017  . Zoster 08/26/2013  she will consider the new Shingrix vaccine; information given, she will read about it  Health Maintenance  Topic Date Due  . TETANUS/TDAP  02/13/2017 (Originally 12/29/2013)  . PNA vac Low Risk Adult (2 of 2 - PPSV23) 02/13/2018  . PAP SMEAR  01/21/2019  . MAMMOGRAM  02/12/2019  . COLONOSCOPY  02/03/2021  . INFLUENZA VACCINE  Completed  . DEXA SCAN  Completed  . ZOSTAVAX  Completed  . Hepatitis C Screening  Completed  . HIV Screening  Completed  MD notes: return for tetanus booster if injured; no more paps; mammo Feb 2019; DEXA ordered Return for Hep C rescreen since last was indeterminate  Discussed health benefits of physical activity, and encouraged her to engage in regular exercise appropriate for her age and condition.   Meds ordered this encounter  Medications  . ASPIRIN 81 PO    Sig: Take 1 tablet by mouth daily.    Current Outpatient Prescriptions:  .  ASPIRIN 81 PO, Take 1 tablet by mouth daily., Disp: , Rfl:  .  atorvastatin (LIPITOR) 20 MG tablet, Take 1 tablet (20 mg total) by mouth at bedtime., Disp: 90 tablet, Rfl: 3 .  CALCIUM PO, Take by mouth., Disp: , Rfl:  .  levothyroxine (SYNTHROID, LEVOTHROID) 75 MCG tablet, Take 1 tablet (75 mcg total) by mouth daily before breakfast., Disp: 90 tablet, Rfl: 3 .  Omega-3 Fatty Acids (FISH OIL PO), Take by mouth., Disp: , Rfl:  There are no discontinued medications.  Next Medicare Wellness Visit in 12+ months  Problem List Items Addressed This Visit      Cardiovascular and Mediastinum   Left atrial enlargement    Suspect mitral regurgitation based on EKG findings; will get ECHO      Relevant Medications   ASPIRIN 81 PO   Other Relevant Orders   ECHOCARDIOGRAM COMPLETE     Digestive   Adenomatous colon polyp     Endocrine    Hypothyroidism, adult    Check TSH 6 weeks after switching to different thyroid preparation      Relevant Orders   TSH     Other   Medication monitoring encounter    Check sgpt in late March or April      Relevant Orders   Comprehensive metabolic panel   Hyperlipidemia LDL goal <100    LDL was over 200; great response to statin; recheck lipids (fasting) in late March or April      Relevant Medications   ASPIRIN 81 PO   Other Relevant Orders   Lipid panel   H/O dysplastic nevus  Followed by dermatologist      Family hx of melanoma    Seeing dermatologist      Encounter for hepatitis C screening test for low risk patient    indeteminite test 2017; recheck      Relevant Orders   Hepatitis C Antibody   Annual physical exam - Primary    Welcome to Medicare visit done today; see AVS       Other Visit Diagnoses    Postmenopause       Relevant Orders   DG Bone Density   Need for pneumococcal vaccination       PCV-13 given today; next vaccine will be PPSV-23 in 12+ months   Relevant Orders   Pneumococcal conjugate vaccine 13-valent (Completed)

## 2017-02-13 NOTE — Assessment & Plan Note (Signed)
Check TSH 6 weeks after switching to different thyroid preparation

## 2017-02-13 NOTE — Assessment & Plan Note (Signed)
Check sgpt in late March or April

## 2017-02-17 ENCOUNTER — Ambulatory Visit
Admission: RE | Admit: 2017-02-17 | Discharge: 2017-02-17 | Disposition: A | Discharge status: Home / Self Care | Payer: Medicare Other | Source: Ambulatory Visit | Attending: Family Medicine | Admitting: Family Medicine

## 2017-02-17 DIAGNOSIS — Z13820 Encounter for screening for osteoporosis: Secondary | ICD-10-CM | POA: Insufficient documentation

## 2017-02-17 DIAGNOSIS — M85852 Other specified disorders of bone density and structure, left thigh: Secondary | ICD-10-CM | POA: Diagnosis not present

## 2017-02-17 DIAGNOSIS — Z780 Asymptomatic menopausal state: Secondary | ICD-10-CM | POA: Diagnosis not present

## 2017-02-17 DIAGNOSIS — M8580 Other specified disorders of bone density and structure, unspecified site: Secondary | ICD-10-CM | POA: Diagnosis not present

## 2017-02-25 ENCOUNTER — Encounter: Payer: Self-pay | Admitting: Family Medicine

## 2017-02-25 DIAGNOSIS — M8580 Other specified disorders of bone density and structure, unspecified site: Secondary | ICD-10-CM

## 2017-02-25 HISTORY — DX: Other specified disorders of bone density and structure, unspecified site: M85.80

## 2017-03-04 ENCOUNTER — Encounter: Payer: Self-pay | Admitting: Family Medicine

## 2017-03-04 NOTE — Telephone Encounter (Signed)
Lynn Sandoval, can you help this patient? Thank you

## 2017-03-05 ENCOUNTER — Telehealth: Payer: Self-pay | Admitting: Family Medicine

## 2017-03-05 NOTE — Telephone Encounter (Signed)
DEE FROM [place] SCHEDULING GIVING INFORMATION ON PATIENT APPT. ASK THAT YOU LET PT KNOW THAT THE APPT IS Tuesday MARCH 13 AT 11AM BUT NEEDS THEM THERE AT 10:30 ARRIVAL.

## 2017-03-09 NOTE — Telephone Encounter (Signed)
Please let pt know

## 2017-03-09 NOTE — Telephone Encounter (Signed)
Patient has already been notified.

## 2017-03-10 ENCOUNTER — Ambulatory Visit
Admission: RE | Admit: 2017-03-10 | Discharge: 2017-03-10 | Disposition: A | Discharge status: Home / Self Care | Payer: Medicare Other | Source: Ambulatory Visit | Attending: Family Medicine | Admitting: Family Medicine

## 2017-03-10 DIAGNOSIS — I071 Rheumatic tricuspid insufficiency: Secondary | ICD-10-CM | POA: Insufficient documentation

## 2017-03-10 DIAGNOSIS — R9431 Abnormal electrocardiogram [ECG] [EKG]: Secondary | ICD-10-CM | POA: Insufficient documentation

## 2017-03-10 DIAGNOSIS — I517 Cardiomegaly: Secondary | ICD-10-CM | POA: Diagnosis not present

## 2017-03-10 DIAGNOSIS — E039 Hypothyroidism, unspecified: Secondary | ICD-10-CM | POA: Diagnosis not present

## 2017-03-10 DIAGNOSIS — E785 Hyperlipidemia, unspecified: Secondary | ICD-10-CM | POA: Insufficient documentation

## 2017-03-10 NOTE — Progress Notes (Signed)
*  PRELIMINARY RESULTS* Echocardiogram 2D Echocardiogram has been performed.  Sherrie Sport 03/10/2017, 11:45 AM

## 2017-03-26 ENCOUNTER — Other Ambulatory Visit: Payer: Self-pay

## 2017-03-26 DIAGNOSIS — Z1159 Encounter for screening for other viral diseases: Secondary | ICD-10-CM | POA: Diagnosis not present

## 2017-03-26 DIAGNOSIS — E039 Hypothyroidism, unspecified: Secondary | ICD-10-CM

## 2017-03-26 DIAGNOSIS — E785 Hyperlipidemia, unspecified: Secondary | ICD-10-CM | POA: Diagnosis not present

## 2017-03-26 DIAGNOSIS — Z5181 Encounter for therapeutic drug level monitoring: Secondary | ICD-10-CM | POA: Diagnosis not present

## 2017-03-26 LAB — COMPREHENSIVE METABOLIC PANEL
ALT: 25 U/L (ref 6–29)
AST: 20 U/L (ref 10–35)
Albumin: 4.3 g/dL (ref 3.6–5.1)
Alkaline Phosphatase: 92 U/L (ref 33–130)
BUN: 11 mg/dL (ref 7–25)
CO2: 22 mmol/L (ref 20–31)
Calcium: 9.2 mg/dL (ref 8.6–10.4)
Chloride: 105 mmol/L (ref 98–110)
Creat: 0.78 mg/dL (ref 0.50–0.99)
Glucose, Bld: 87 mg/dL (ref 65–99)
Potassium: 4.8 mmol/L (ref 3.5–5.3)
Sodium: 142 mmol/L (ref 135–146)
Total Bilirubin: 0.5 mg/dL (ref 0.2–1.2)
Total Protein: 6.6 g/dL (ref 6.1–8.1)

## 2017-03-26 LAB — LIPID PANEL
Cholesterol: 178 mg/dL (ref <200)
HDL: 79 mg/dL (ref >50)
LDL Cholesterol: 86 mg/dL (ref <100)
Total CHOL/HDL Ratio: 2.3 Ratio (ref <5.0)
Triglycerides: 64 mg/dL (ref <150)
VLDL: 13 mg/dL (ref <30)

## 2017-03-26 LAB — TSH: TSH: 1.84 mIU/L

## 2017-03-27 LAB — HEPATITIS C ANTIBODY: HCV Ab: NEGATIVE

## 2017-04-13 DIAGNOSIS — L821 Other seborrheic keratosis: Secondary | ICD-10-CM | POA: Diagnosis not present

## 2017-04-13 DIAGNOSIS — D485 Neoplasm of uncertain behavior of skin: Secondary | ICD-10-CM | POA: Diagnosis not present

## 2017-04-13 DIAGNOSIS — D229 Melanocytic nevi, unspecified: Secondary | ICD-10-CM | POA: Diagnosis not present

## 2017-04-13 DIAGNOSIS — L720 Epidermal cyst: Secondary | ICD-10-CM | POA: Diagnosis not present

## 2017-09-17 ENCOUNTER — Ambulatory Visit (INDEPENDENT_AMBULATORY_CARE_PROVIDER_SITE_OTHER): Payer: Medicare Other | Admitting: Family Medicine

## 2017-09-17 ENCOUNTER — Encounter: Payer: Self-pay | Admitting: Family Medicine

## 2017-09-17 VITALS — BP 118/81 | HR 101 | Temp 98.5°F | Resp 17 | Ht 64.0 in | Wt 156.8 lb

## 2017-09-17 DIAGNOSIS — R109 Unspecified abdominal pain: Secondary | ICD-10-CM | POA: Insufficient documentation

## 2017-09-17 DIAGNOSIS — Z23 Encounter for immunization: Secondary | ICD-10-CM

## 2017-09-17 DIAGNOSIS — R1013 Epigastric pain: Secondary | ICD-10-CM

## 2017-09-17 LAB — LIPASE: Lipase: 17 U/L (ref 7–60)

## 2017-09-17 LAB — CBC WITH DIFFERENTIAL/PLATELET
Basophils Absolute: 40 cells/uL (ref 0–200)
Basophils Relative: 0.6 %
Eosinophils Absolute: 172 cells/uL (ref 15–500)
Eosinophils Relative: 2.6 %
HCT: 43.4 % (ref 35.0–45.0)
Hemoglobin: 14.1 g/dL (ref 11.7–15.5)
Lymphs Abs: 2224 cells/uL (ref 850–3900)
MCH: 27.5 pg (ref 27.0–33.0)
MCHC: 32.5 g/dL (ref 32.0–36.0)
MCV: 84.8 fL (ref 80.0–100.0)
MPV: 10.4 fL (ref 7.5–12.5)
Monocytes Relative: 7.7 %
Neutro Abs: 3656 cells/uL (ref 1500–7800)
Neutrophils Relative %: 55.4 %
Platelets: 293 10*3/uL (ref 140–400)
RBC: 5.12 10*6/uL — ABNORMAL HIGH (ref 3.80–5.10)
RDW: 12.5 % (ref 11.0–15.0)
Total Lymphocyte: 33.7 %
WBC mixed population: 508 cells/uL (ref 200–950)
WBC: 6.6 10*3/uL (ref 3.8–10.8)

## 2017-09-17 LAB — COMPLETE METABOLIC PANEL WITH GFR
AG Ratio: 2 (calc) (ref 1.0–2.5)
ALT: 20 U/L (ref 6–29)
AST: 18 U/L (ref 10–35)
Albumin: 4.6 g/dL (ref 3.6–5.1)
Alkaline phosphatase (APISO): 106 U/L (ref 33–130)
BUN: 10 mg/dL (ref 7–25)
CO2: 28 mmol/L (ref 20–32)
Calcium: 9.3 mg/dL (ref 8.6–10.4)
Chloride: 103 mmol/L (ref 98–110)
Creat: 0.77 mg/dL (ref 0.50–0.99)
GFR, Est African American: 94 mL/min/{1.73_m2} (ref >=60)
GFR, Est Non African American: 81 mL/min/{1.73_m2} (ref >=60)
Globulin: 2.3 g/dL (calc) (ref 1.9–3.7)
Glucose, Bld: 93 mg/dL (ref 65–99)
Potassium: 5 mmol/L (ref 3.5–5.3)
Sodium: 140 mmol/L (ref 135–146)
Total Bilirubin: 0.3 mg/dL (ref 0.2–1.2)
Total Protein: 6.9 g/dL (ref 6.1–8.1)

## 2017-09-17 LAB — AMYLASE: Amylase: 46 U/L (ref 21–101)

## 2017-09-17 NOTE — Patient Instructions (Signed)
Let's get labs today I've ordered an ultrasound If you have not heard anything from my staff in a week about any orders/referrals/studies from today, please contact us here to follow-up [phone] If your symptoms recur and are moderate to severe, please do go to the emergency department

## 2017-09-17 NOTE — Assessment & Plan Note (Signed)
ddx includes pancreatitis, cholecystitis, gastritis, duodenitis, IBS, etc.; check labs, [place]; consider HIDA scan if [place] normal; next would be CT scan to look at pancreas, to ER if recurs or severe

## 2017-09-17 NOTE — Progress Notes (Signed)
BP 118/81 (BP Location: Left Arm, Patient Position: Sitting, Cuff Size: Large)   Pulse (!) 101   Temp 98.5 F (36.9 C) (Oral)   Resp 17   Ht 5\' 4"  (1.626 m)   Wt 156 lb 12.8 oz (71.1 kg)   SpO2 96%   BMI 26.91 kg/m    Subjective:    Patient ID: Lynn Sandoval, female    DOB: 01-09-1951, 66 y.o.   MRN: [id number]  HPI: Lynn Sandoval is a 66 y.o. female  Chief Complaint  Patient presents with  . Gas    pt complains of having gastro problem assoxiated with diarrhea xseveral months pain radiates from back to lower abdominal    HPI She has recurrent episodes lasting for about a day; most recent was Monday, and before that was middle of May, a few end of 2017, all within the last year Last episode was Monday (3 days ago) Real bad pain No vomiting Not much diarrhea or cramping She wishes she could throw up and get it over with; just lays there and moans for 6-8 hours; just gradually passes Pain radiates around her mid-section to her back This last time was just epigastrium Just a really bad ache more than a sharp pain Her husband had pancreatitis, and compared symptoms with him Nauseated with no vomiting She cannot pinpoint that it follows any particular food She had hamburgers and french fries before the last episode, night before The other times no particular greasy foods or anything that sets it off Her mother had gallbladder removed after several attacks No blood in stool or urine Had some chills this last time, but no fever; had to use a blanket Modest weight loss, she has been working out, gym 3-4 x a week, thinks more from her hard work than loss of appetite No excessive gas from above or below usually, but does belch from above when those episodes Just fine in between episodes Interested in tetanus today; she had a flu the last of October last year; could get that today  Depression screen [facility] 2/9 09/17/2017 02/13/2017 07/21/2016 01/21/2016 08/07/2015  Decreased Interest 0 0  0 0 1  Down, Depressed, Hopeless 0 0 0 1 1  PHQ - 2 Score 0 0 0 1 2  Altered sleeping - - - - 3  Tired, decreased energy - - - - 2  Change in appetite - - - - 0  Feeling bad or failure about yourself  - - - - 1  Trouble concentrating - - - - 1  Moving slowly or fidgety/restless - - - - 0  Suicidal thoughts - - - - 0  PHQ-9 Score - - - - 9  Difficult doing work/chores - - - - Not difficult at all    Relevant past medical, surgical, family and social history reviewed Past Medical History:  Diagnosis Date  . Arthritis    fingers  . Colon polyps   . Family history of adverse reaction to anesthesia    son - slow to wake  . H/O dysplastic nevus 02/13/2017   Followed by Dr. Brendolyn Patty  . Hyperlipidemia   . Hypothyroidism   . Motion sickness    boats  . Osteopenia 02/25/2017   DEXA Feb 2018  . Thyroid disease    Past Surgical History:  Procedure Laterality Date  . [place]  . COLONOSCOPY WITH PROPOFOL N/A 02/04/2016   Procedure: COLONOSCOPY WITH PROPOFOL;  Surgeon: Lucilla Lame, MD;  Location: [place];  Service: Endoscopy;  Laterality: N/A;  . EYE SURGERY  1955  . POLYPECTOMY  02/04/2016   Procedure: POLYPECTOMY;  Surgeon: Lucilla Lame, MD;  Location: [place];  Service: Endoscopy;;  . TONSILLECTOMY  1960  . TONSILLECTOMY     Family History  Problem Relation Age of Onset  . Depression Mother   . Hearing loss Mother   . Hyperlipidemia Mother   . Hypertension Mother   . Glaucoma Mother   . Cancer Mother        melanoma  . Heart disease Father   . Heart attack Father   . Diabetes Maternal Grandmother   . Heart attack Maternal Grandmother   . Cancer Paternal Grandmother        colon  . Breast cancer Neg Hx    Social History   Social History  . Marital status: Married    Spouse name: N/A  . Number of children: 4  . Years of education: N/A   Occupational History  . retired    Social History Main Topics  . Smoking  status: Never Smoker  . Smokeless tobacco: Never Used  . Alcohol use 1.8 oz/week    3 Glasses of wine per week     Comment: ocassional glass of wine  . Drug use: No  . Sexual activity: No   Other Topics Concern  . Not on file   Social History Narrative  . No narrative on file    Interim medical history since last visit reviewed. Allergies and medications reviewed  Review of Systems Per HPI unless specifically indicated above     Objective:    BP 118/81 (BP Location: Left Arm, Patient Position: Sitting, Cuff Size: Large)   Pulse (!) 101   Temp 98.5 F (36.9 C) (Oral)   Resp 17   Ht 5\' 4"  (1.626 m)   Wt 156 lb 12.8 oz (71.1 kg)   SpO2 96%   BMI 26.91 kg/m   Wt Readings from Last 3 Encounters:  09/17/17 156 lb 12.8 oz (71.1 kg)  02/13/17 161 lb 9.6 oz (73.3 kg)  07/21/16 163 lb (73.9 kg)    Physical Exam  Constitutional: She appears well-developed and well-nourished. No distress.  HENT:  Head: Normocephalic and atraumatic.  Eyes: EOM are normal. No scleral icterus.  Neck: No thyromegaly present.  Cardiovascular: Normal rate, regular rhythm and normal heart sounds.   No murmur heard. Heart rate under 100 during auscultation  Pulmonary/Chest: Effort normal and breath sounds normal. No respiratory distress. She has no wheezes.  Abdominal: Soft. Bowel sounds are normal. She exhibits no distension and no mass. There is no tenderness. There is no guarding.  Musculoskeletal: Normal range of motion. She exhibits no edema.  Neurological: She is alert. She exhibits normal muscle tone.  Skin: Skin is warm and dry. She is not diaphoretic. No pallor.  Psychiatric: She has a normal mood and affect. Her behavior is normal. Judgment and thought content normal.   Results for orders placed or performed in visit on 03/26/17  Hepatitis C Antibody  Result Value Ref Range   HCV Ab NEGATIVE NEGATIVE  Lipid panel  Result Value Ref Range   Cholesterol 178 <200 mg/dL   Triglycerides  64 <150 mg/dL   HDL 79 >50 mg/dL   Total CHOL/HDL Ratio 2.3 <5.0 Ratio   VLDL 13 <30 mg/dL   LDL Cholesterol 86 <100 mg/dL  Comprehensive metabolic panel  Result Value Ref Range   Sodium  142 135 - 146 mmol/L   Potassium 4.8 3.5 - 5.3 mmol/L   Chloride 105 98 - 110 mmol/L   CO2 22 20 - 31 mmol/L   Glucose, Bld 87 65 - 99 mg/dL   BUN 11 7 - 25 mg/dL   Creat 0.78 0.50 - 0.99 mg/dL   Total Bilirubin 0.5 0.2 - 1.2 mg/dL   Alkaline Phosphatase 92 33 - 130 U/L   AST 20 10 - 35 U/L   ALT 25 6 - 29 U/L   Total Protein 6.6 6.1 - 8.1 g/dL   Albumin 4.3 3.6 - 5.1 g/dL   Calcium 9.2 8.6 - 10.4 mg/dL  TSH  Result Value Ref Range   TSH 1.84 mIU/L      Assessment & Plan:   Problem List Items Addressed This Visit      Other   Abdominal pain - Primary    ddx includes pancreatitis, cholecystitis, gastritis, duodenitis, IBS, etc.; check labs, [place]; consider HIDA scan if [place] normal; next would be CT scan to look at pancreas, to ER if recurs or severe      Relevant Orders   CBC with Differential/Platelet   COMPLETE METABOLIC PANEL WITH GFR   Lipase   Amylase   US Abdomen Complete    Other Visit Diagnoses    Need for influenza vaccination       Relevant Orders   Flu vaccine HIGH DOSE PF (Fluzone High dose) (Completed)   Need for Tdap vaccination       Relevant Orders   Tdap vaccine greater than or equal to 7yo IM (Completed)       Follow up plan: Return if symptoms worsen or fail to improve.  An after-visit summary was printed and given to the patient at [place].  Please see the patient instructions which may contain other information and recommendations beyond what is mentioned above in the assessment and plan.  No orders of the defined types were placed in this encounter.   Orders Placed This Encounter  Procedures  . US Abdomen Complete  . Flu vaccine HIGH DOSE PF (Fluzone High dose)  . Tdap vaccine greater than or equal to 7yo IM  . CBC with Differential/Platelet  .  COMPLETE METABOLIC PANEL WITH GFR  . Lipase  . Amylase

## 2017-09-24 ENCOUNTER — Encounter: Payer: Self-pay | Admitting: Family Medicine

## 2017-09-24 ENCOUNTER — Ambulatory Visit
Admission: RE | Admit: 2017-09-24 | Discharge: 2017-09-24 | Disposition: A | Discharge status: Home / Self Care | Payer: Medicare Other | Source: Ambulatory Visit | Attending: Family Medicine | Admitting: Family Medicine

## 2017-09-24 DIAGNOSIS — R1013 Epigastric pain: Secondary | ICD-10-CM | POA: Diagnosis not present

## 2017-09-24 DIAGNOSIS — K8020 Calculus of gallbladder without cholecystitis without obstruction: Secondary | ICD-10-CM | POA: Insufficient documentation

## 2017-09-24 HISTORY — DX: Calculus of gallbladder without cholecystitis without obstruction: K80.20

## 2017-09-28 ENCOUNTER — Encounter: Payer: Self-pay | Admitting: Surgery

## 2017-09-28 ENCOUNTER — Ambulatory Visit (INDEPENDENT_AMBULATORY_CARE_PROVIDER_SITE_OTHER): Payer: Medicare Other | Admitting: Surgery

## 2017-09-28 VITALS — BP 146/79 | HR 86 | Temp 98.4°F | Ht 64.0 in | Wt 155.8 lb

## 2017-09-28 DIAGNOSIS — K8020 Calculus of gallbladder without cholecystitis without obstruction: Secondary | ICD-10-CM

## 2017-09-28 NOTE — Progress Notes (Signed)
Patient ID: Lynn Sandoval, female   DOB: 1951-04-28, 66 y.o.   MRN: [id number]  HPI Lynn Sandoval is a 66 y.o. female seen in consultation at the request of Dr. Steva Ready. She reports having episodes of RUQ pain. She reports that there is no specific aggravating or aggravating factors. She experiences right upper quadrant pain that is sharp and moderate in severity. As you nausea. No vomiting no fevers or chills no evidence of cholangitis or obstructive jaundice. She does have a family history positive for gallbladder disease that have responded to cholecystectomy in the past. She is able to perform more than 4 Mets without any shortness of breath or chest pain. LFTs are completely normal. Ultrasound have personally reviewed it. There is evidence of cholelithiasis. Normal common bile duct no evidence of cholecystitis.  HPI  Past Medical History:  Diagnosis Date  . Arthritis    fingers  . Colon polyps   . Family history of adverse reaction to anesthesia    son - slow to wake  . H/O dysplastic nevus 02/13/2017   Followed by Dr. Brendolyn Patty  . Hyperlipidemia   . Hypothyroidism   . Motion sickness    boats  . Osteopenia 02/25/2017   DEXA Feb 2018  . Thyroid disease     Past Surgical History:  Procedure Laterality Date  . [place]  . COLONOSCOPY WITH PROPOFOL N/A 02/04/2016   Procedure: COLONOSCOPY WITH PROPOFOL;  Surgeon: Lucilla Lame, MD;  Location: [place];  Service: Endoscopy;  Laterality: N/A;  . EYE SURGERY  1955  . POLYPECTOMY  02/04/2016   Procedure: POLYPECTOMY;  Surgeon: Lucilla Lame, MD;  Location: [place];  Service: Endoscopy;;  . TONSILLECTOMY  1960  . TONSILLECTOMY      Family History  Problem Relation Age of Onset  . Depression Mother   . Hearing loss Mother   . Hyperlipidemia Mother   . Hypertension Mother   . Glaucoma Mother   . Cancer Mother        melanoma  . Heart disease Father   . Heart attack Father   . Diabetes  Maternal Grandmother   . Heart attack Maternal Grandmother   . Cancer Paternal Grandmother        colon  . Breast cancer Neg Hx     Social History Social History  Substance Use Topics  . Smoking status: Never Smoker  . Smokeless tobacco: Never Used  . Alcohol use 1.8 oz/week    3 Glasses of wine per week     Comment: ocassional glass of wine    No Known Allergies  Current Outpatient Prescriptions  Medication Sig Dispense Refill  . ASPIRIN 81 PO Take 1 tablet by mouth daily.    Marland Kitchen atorvastatin (LIPITOR) 20 MG tablet Take 1 tablet (20 mg total) by mouth at bedtime. 90 tablet 3  . CALCIUM PO Take by mouth.    . levothyroxine (SYNTHROID, LEVOTHROID) 75 MCG tablet Take 1 tablet (75 mcg total) by mouth daily before breakfast. 90 tablet 3  . Omega-3 Fatty Acids (FISH OIL PO) Take by mouth.     No current facility-administered medications for this visit.      Review of Systems Full ROS  was asked and was negative except for the information on the HPI  Physical Exam Blood pressure (!) 146/79, pulse 86, temperature 98.4 F (36.9 C), temperature source Oral, height 5\' 4"  (1.626 m), weight 70.7 kg (155 lb 12.8 oz). CONSTITUTIONAL: NAD  EYES: Pupils are equal, round, a Sclera are non-icteric. EARS, NOSE, MOUTH AND THROAT: The oropharynx is clear. The oral mucosa is pink and moist. Hearing is intact to voice. LYMPH NODES:  Lymph nodes in the neck are normal. RESPIRATORY:  Lungs are clear. There is normal respiratory effort, with equal breath sounds bilaterally, and without pathologic use of accessory muscles. CARDIOVASCULAR: Heart is regular without murmurs, gallops, or rubs. GI: The abdomen is soft, nontender, and nondistended. There are no palpable masses. There is no hepatosplenomegaly. There are normal bowel sounds in all quadrants. GU: Rectal deferred.   MUSCULOSKELETAL: Normal muscle strength and tone. No cyanosis or edema.   SKIN: Turgor is good and there are no pathologic skin  lesions or ulcers. NEUROLOGIC: Motor and sensation is grossly normal. Cranial nerves are grossly intact. PSYCH:  Oriented to person, place and time. Affect is normal.  Data Reviewed  I have personally reviewed the patient's imaging, laboratory findings and medical records.    Assessment / Plan 66 year old female with a history of symptomatic cholelithiasis. Discussed with the patient in detail about her disease process and my recommendation for cholecystectomy.The risks, benefits, complications, treatment options, and expected outcomes were discussed with the patient. The possibilities of bleeding, recurrent infection, finding a normal gallbladder, perforation of viscus organs, damage to surrounding structures, bile leak, abscess formation, needing a drain placed, the need for additional procedures, reaction to medication, pulmonary aspiration,  failure to diagnose a condition, the possible need to convert to an open procedure, and creating a complication requiring transfusion or operation were discussed with the patient. The patient and/or family concurred with the proposed plan, giving informed consent.  Operative this report will be sent to the referring provider  Caroleen Hamman, MD FACS General Surgeon 09/28/2017, 3:04 PM

## 2017-09-28 NOTE — Patient Instructions (Signed)
You have requested to have your gallbladder removed. This will be done on 10/16 at [facility] with Dr. Dahlia Byes.  You will most likely be out of work 1-2 weeks for this surgery. You will return after your post-op appointment with a lifting restriction for approximately 4 more weeks.  You will be able to eat anything you would like to following surgery. But, start by eating a bland diet and advance this as tolerated. The Gallbladder diet is below, please go as closely by this diet as possible prior to surgery to avoid any further attacks.  Please see the (blue)pre-care form that you have been given today. If you have any questions, please call our office.  Laparoscopic Cholecystectomy Laparoscopic cholecystectomy is surgery to remove the gallbladder. The gallbladder is located in the upper right part of the abdomen, behind the liver. It is a storage sac for bile, which is produced in the liver. Bile aids in the digestion and absorption of fats. Cholecystectomy is often done for inflammation of the gallbladder (cholecystitis). This condition is usually caused by a buildup of gallstones (cholelithiasis) in the gallbladder. Gallstones can block the flow of bile, and that can result in inflammation and pain. In severe cases, emergency surgery may be required. If emergency surgery is not required, you will have time to prepare for the procedure. Laparoscopic surgery is an alternative to open surgery. Laparoscopic surgery has a shorter recovery time. Your common bile duct may also need to be examined during the procedure. If stones are found in the common bile duct, they may be removed. LET [facility] CARE PROVIDER KNOW ABOUT:  Any allergies you have.  All medicines you are taking, including vitamins, herbs, eye drops, creams, and over-the-counter medicines.  Previous problems you or members of your family have had with the use of anesthetics.  Any blood disorders you have.  Previous surgeries you  have had.    Any medical conditions you have. RISKS AND COMPLICATIONS Generally, this is a safe procedure. However, problems may occur, including:  Infection.  Bleeding.  Allergic reactions to medicines.  Damage to other structures or organs.  A stone remaining in the common bile duct.  A bile leak from the cyst duct that is clipped when your gallbladder is removed.  The need to convert to open surgery, which requires a larger incision in the abdomen. This may be necessary if your surgeon thinks that it is not safe to continue with a laparoscopic procedure. BEFORE THE PROCEDURE  Ask your health care provider about:  Changing or stopping your regular medicines. This is especially important if you are taking diabetes medicines or blood thinners.  Taking medicines such as aspirin and ibuprofen. These medicines can thin your blood. Do not take these medicines before your procedure if your health care provider instructs you not to.  Follow instructions from your health care provider about eating or drinking restrictions.  Let your health care provider know if you develop a cold or an infection before surgery.  Plan to have someone take you home after the procedure.  Ask your health care provider how your surgical site will be marked or identified.  You may be given antibiotic medicine to help prevent infection. PROCEDURE  To reduce your risk of infection:  Your health care team will wash or sanitize their hands.  Your skin will be washed with soap.  An IV tube may be inserted into one of your veins.  You will be given a medicine to make  you fall asleep (general anesthetic).  A breathing tube will be placed in your mouth.  The surgeon will make several small cuts (incisions) in your abdomen.  A thin, lighted tube (laparoscope) that has a tiny camera on the end will be inserted through one of the small incisions. The camera on the laparoscope will send a picture to a TV  screen (monitor) in the operating room. This will give the surgeon a good view inside your abdomen.  A gas will be pumped into your abdomen. This will expand your abdomen to give the surgeon more room to perform the surgery.  Other tools that are needed for the procedure will be inserted through the other incisions. The gallbladder will be removed through one of the incisions.  After your gallbladder has been removed, the incisions will be closed with stitches (sutures), staples, or skin glue.  Your incisions may be covered with a bandage (dressing). The procedure may vary among health care providers and hospitals. AFTER THE PROCEDURE  Your blood pressure, heart rate, breathing rate, and blood oxygen level will be monitored often until the medicines you were given have worn off.  You will be given medicines as needed to control your pain.   This information is not intended to replace advice given to you by your health care provider. Make sure you discuss any questions you have with your health care provider.   Document Released: 12/15/2005 Document Revised: 09/05/2015 Document Reviewed: 07/27/2013 Elsevier Interactive Patient Education 2016 [place] Diet for Gallbladder Conditions A low-fat diet can be helpful if you have pancreatitis or a gallbladder condition. With these conditions, your pancreas and gallbladder have trouble digesting fats. A healthy eating plan with less fat will help rest your pancreas and gallbladder and reduce your symptoms. WHAT DO I NEED TO KNOW ABOUT THIS DIET?  Eat a low-fat diet.  Reduce your fat intake to less than 20-30% of your total daily calories. This is less than 50-60 g of fat per day.  Remember that you need some fat in your diet. Ask your dietician what your daily goal should be.  Choose nonfat and low-fat healthy foods. Look for the words "nonfat," "low fat," or "fat free."  As a guide, look on the label and choose foods with  less than 3 g of fat per serving. Eat only one serving.  Avoid alcohol.  Do not smoke. If you need help quitting, talk with your health care provider.  Eat small frequent meals instead of three large heavy meals. WHAT FOODS CAN I EAT? Grains Include healthy grains and starches such as potatoes, wheat bread, fiber-rich cereal, and brown rice. Choose whole grain options whenever possible. In adults, whole grains should account for 45-65% of your daily calories.  Fruits and Vegetables Eat plenty of fruits and vegetables. Fresh fruits and vegetables add fiber to your diet. Meats and Other Protein Sources Eat lean meat such as chicken and pork. Trim any fat off of meat before cooking it. Eggs, fish, and beans are other sources of protein. In adults, these foods should account for 10-35% of your daily calories. Dairy Choose low-fat milk and dairy options. Dairy includes fat and protein, as well as calcium.  Fats and Oils Limit high-fat foods such as fried foods, sweets, baked goods, sugary drinks.  Other Creamy sauces and condiments, such as mayonnaise, can add extra fat. Think about whether or not you need to use them, or use smaller amounts or low fat options.  WHAT FOODS ARE NOT RECOMMENDED?  High fat foods, such as:  [employer].  Ice cream.  [place] toast.  Sweet rolls.  Pizza.  Cheese bread.  Foods covered with batter, butter, creamy sauces, or cheese.  Fried foods.  Sugary drinks and desserts.  Foods that cause gas or bloating   This information is not intended to replace advice given to you by your health care provider. Make sure you discuss any questions you have with your health care provider.   Document Released: 12/20/2013 Document Reviewed: 12/20/2013 Elsevier Interactive Patient Education [employer].

## 2017-09-30 ENCOUNTER — Telehealth: Payer: Self-pay | Admitting: Surgery

## 2017-09-30 NOTE — Telephone Encounter (Signed)
Pt advised of pre op date/time and sx date. Sx: 10/13/17 with Dr Pabon-Laparoscopic cholecystectomy.  Pre op: 10/06/17 between 9-1:00pm--phone.   Patient made aware to call [phone], between 1-3:00pm the day before surgery, to find out what time to arrive.

## 2017-10-06 ENCOUNTER — Encounter
Admission: RE | Admit: 2017-10-06 | Discharge: 2017-10-06 | Disposition: A | Discharge status: Home / Self Care | Payer: Medicare Other | Source: Ambulatory Visit | Attending: Surgery | Admitting: Surgery

## 2017-10-06 HISTORY — DX: Cardiac murmur, unspecified: R01.1

## 2017-10-06 NOTE — Patient Instructions (Signed)
  Your procedure is scheduled on: 10-13-17  Report to Same Day Surgery 2nd floor medical mall [facility] Entrance-take elevator on left to 2nd floor.  Check in with surgery information desk.) To find out your arrival time please call [phone] between 1PM - 3PM on 10-12-17  Remember: Instructions that are not followed completely may result in serious medical risk, up to and including death, or upon the discretion of your surgeon and anesthesiologist your surgery may need to be rescheduled.    _x___ 1. Do not eat food after midnight the night before your procedure. NO GUM CHEWING OR HARD CANDIES.  You may drink clear liquids up to 2 hours before you are scheduled to arrive at the hospital for your procedure.  Do not drink clear liquids within 2 hours of your scheduled arrival to the hospital.  Clear liquids include  --Water or Apple juice without pulp  --Clear carbohydrate beverage such as ClearFast or Gatorade  --Black Coffee or Clear Tea (No milk, no creamers, do not add anything to the coffee or Tea)  Type 1 and type 2 diabetics should only drink water.     __x__ 2. No Alcohol for 24 hours before or after surgery.   __x__3. No Smoking for 24 prior to surgery.   ____  4. Bring all medications with you on the day of surgery if instructed.    __x__ 5. Notify your doctor if there is any change in your medical condition     (cold, fever, infections).     Do not wear jewelry, make-up, hairpins, clips or nail polish.  Do not wear lotions, powders, or perfumes. You may wear deodorant.  Do not shave 48 hours prior to surgery. Men may shave face and neck.  Do not bring valuables to the hospital.    [facility] is not responsible for any belongings or valuables.               Contacts, dentures or bridgework may not be worn into surgery.  Leave your suitcase in the car. After surgery it may be brought to your room.  For patients admitted to the hospital, discharge time is determined  by your treatment team.   Patients discharged the day of surgery will not be allowed to drive home.  You will need someone to drive you home and stay with you the night of your procedure.    Please read over the following fact sheets that you were given:    _x___ [place] WITH A SMALL SIP OF WATER. These include:  1. LEVOTHYROXINE  2.  3.  4.  5.  6.  ____Fleets enema or Magnesium Citrate as directed.   _x___ Use CHG Soap or sage wipes as directed on instruction sheet   ____ Use inhalers on the day of surgery and bring to hospital day of surgery  ____ Stop Metformin and Janumet 2 days prior to surgery.    ____ Take 1/2 of usual insulin dose the night before surgery and none on the morning surgery.   _x___ Follow recommendations from Cardiologist, Pulmonologist or PCP regarding stopping Aspirin, Coumadin, Plavix ,Eliquis, Effient, or Pradaxa, and Pletal-STOP ASPIRIN NOW  X____Stop Anti-inflammatories such as Advil, Aleve, Ibuprofen, Motrin, Naproxen, Naprosyn, Goodies powders or aspirin products NOW-OK to take Tylenol    _x___ Stop supplements until after surgery-STOP [place]   ____ Bring C-Pap to the hospital.

## 2017-10-06 NOTE — Pre-Procedure Instructions (Signed)
[facility]  ECHO COMPLETE WO IMAGING ENHANCING AGENT  Order# [id number]  Reading physician: Nori Riis, PA-C Ordering physician: Arnetha Courser, MD Study date: 03/10/17  Result Notes   Notes recorded by Arnetha Courser, MD on 03/16/2017 at [place] PM EDT Greetings. Here are the results of your echocardiogram. The term "grade 1 diastolic dysfunction" is a fairly common finding, which means the heart takes a little longer to relax after it squeezes. This is not uncommon in individuals 65 or over. You also have mild regurgitation or back flow of blood at the mitral valve (the swinging doors on the left side of your heart). Overall, there is nothing worrisome about your heart structurally. If you have specific questions or want to discuss, I can see you in the office or you can write or call. Peace, Dr. Sanda Klein      Study Result   Result status: Final result                   *[place],  [id number]                            [phone]  ------------------------------------------------------------------- Transthoracic Echocardiography  Patient:    Elyssia, Strausser MR #:       [id number] Study Date: 03/10/2017 Gender:     F Age:        38 Height:     162.6 cm Weight:     73.3 kg BSA:        1.84 m^2 Pt. Status: Room:   ATTENDING    Mliss Sax, Melinda P  REFERRING    Enid Derry P  SONOGRAPHER  [place], Medical  cc:  ------------------------------------------------------------------- LV EF: 65%  ------------------------------------------------------------------- Indications:      Abnormal ECG 794.31.  ------------------------------------------------------------------- History:   PMH:  Hypothyroidism, hyperlipidemia.  ------------------------------------------------------------------- Study  Conclusions  - Left ventricle: The cavity size was normal. Systolic function was   vigorous. The estimated ejection fraction was 65%. Wall motion   was normal; there were no regional wall motion abnormalities.   Doppler parameters are consistent with abnormal left ventricular   relaxation (grade 1 diastolic dysfunction). - Aortic valve: Valve area (Vmax): 2.29 cm^2. - Mitral valve: There was mild regurgitation. - Atrial septum: No defect or patent foramen ovale was identified.  Impressions:  - Normal LVEF and normal Wall motion with mild MR.  ------------------------------------------------------------------- Study data:   Study status:  Routine.  Procedure:  The patient reported no pain pre or post test. Transthoracic echocardiography. Image quality was adequate.          Transthoracic echocardiography.  M-mode, complete 2D, spectral Doppler, and color Doppler.  Birthdate:  Patient birthdate: 01/19/1951.  Age:  Patient is 66 yr old.  Sex:  Gender: female.    BMI: 27.7 kg/m^2.  Patient status:  Outpatient.  Study date:  Study date: 03/10/2017. Study time: 11:12 AM.  Location:  Echo laboratory.  -------------------------------------------------------------------  ------------------------------------------------------------------- Left ventricle:  The cavity size was normal. Systolic function was vigorous. The estimated ejection fraction  was 65%. Wall motion was normal; there were no regional wall motion abnormalities. Doppler parameters are consistent with abnormal left ventricular relaxation (grade 1 diastolic dysfunction).  ------------------------------------------------------------------- Aortic valve:   Structurally normal valve.   Cusp separation was normal.  Doppler:  Transvalvular velocity was within the normal range. There was no stenosis. There was no significant regurgitation.    Peak velocity ratio of LVOT to aortic valve: 0.73. Valve area (Vmax): 2.29 cm^2.  Indexed valve area (Vmax): 1.25 cm^2/m^2.    Peak gradient (S): 6 mm Hg.  ------------------------------------------------------------------- Mitral valve:   Doppler:  There was mild regurgitation.    Peak gradient (D): 4 mm Hg.  ------------------------------------------------------------------- Left atrium:  The atrium was at the upper limits of normal in size.   ------------------------------------------------------------------- Atrial septum:  No defect or patent foramen ovale was identified.   ------------------------------------------------------------------- Pulmonary veins: Common pulmonary vein:  The Doppler velocity and flow profile were normal.  ------------------------------------------------------------------- Right ventricle:  The cavity size was normal. Wall thickness was normal. Systolic function was normal.  ------------------------------------------------------------------- Tricuspid valve:   Doppler:  There was mild regurgitation.  ------------------------------------------------------------------- Measurements   Left ventricle                           Value          Reference  LV ID, ED, PLAX chordal          (L)     38.2  mm       43 - 52  LV ID, ES, PLAX chordal                  23.6  mm       23 - 38  LV fx shortening, PLAX chordal           38    %        >=29  LV PW thickness, ED                      10.8  mm       ----------  IVS/LV PW ratio, ED                      0.8            <=1.3  LV e&', lateral                           7.29  cm/s     ----------  LV E/e&', lateral                         13.85          ----------  LV e&', medial                            6.31  cm/s     ----------  LV E/e&', medial                          16.01          ----------  LV e&', average                           6.8   cm/s     ----------  LV E/e&', average                         14.85          ----------    Ventricular septum                       Value           Reference  IVS thickness, ED                        8.67  mm       ----------    LVOT                                     Value          Reference  LVOT ID, S                               20    mm       ----------  LVOT area                                3.14  cm^2     ----------  LVOT peak velocity, S                    85.9  cm/s     ----------    Aortic valve                             Value          Reference  Aortic valve peak velocity, S            118   cm/s     ----------  Aortic peak gradient, S                  6     mm Hg    ----------  Velocity ratio, peak, LVOT/AV            0.73           ----------  Aortic valve area, peak velocity         2.29  cm^2     ----------  Aortic valve area/bsa, peak              1.25  cm^2/m^2 ----------  velocity    Aorta                                    Value          Reference  Aortic root ID, ED                       26    mm       ----------    Left atrium                              Value          Reference  LA ID, A-P,  ES                           27    mm       ----------  LA ID/bsa, A-P                           1.47  cm/m^2   <=2.2  LA volume, ES, 1-p A4C                   24.3  ml       ----------  LA volume/bsa, ES, 1-p A4C               13.2  ml/m^2   ----------  LA volume, ES, 1-p A2C                   40.7  ml       ----------  LA volume/bsa, ES, 1-p A2C               22.1  ml/m^2   ----------    Mitral valve                             Value          Reference  Mitral E-wave peak velocity              101   cm/s     ----------  Mitral A-wave peak velocity              73.2  cm/s     ----------  Mitral deceleration time                 216   ms       150 - 230  Mitral peak gradient, D                  4     mm Hg    ----------  Mitral E/A ratio, peak                   1.4            ----------    Right atrium                             Value          Reference  RA ID, S-I, ES, A4C                      40.2  mm        34 - 49  RA area, ES, A4C                         9.58  cm^2     8.3 - 19.5  RA volume, ES, A/L                       17.7  ml       ----------  RA volume/bsa, ES, A/L                   9.6   ml/m^2   ----------    Right ventricle  Value          Reference  RV ID, ED, PLAX                          25.1  mm       19 - 38  TAPSE                                    29.2  mm       ----------    Pulmonic valve                           Value          Reference  Pulmonic valve peak velocity, S          85.4  cm/s     ----------  Legend: (L)  and  (H)  mark values outside specified reference range.  ------------------------------------------------------------------- Prepared and Electronically Authenticated by  Neoma Laming, MD 2018-03-13T14:55:13  [facility] Images   Show images for ECHOCARDIOGRAM COMPLETE  Patient Information   Patient Name Tajha, Sammarco Sex Female DOB 1951/01/12 SSN [id number]  Reason For Exam  Priority: Routine  Dx: Left atrial enlargement [I51.7 (ICD-10-CM)]  Comments: Suspect mitral regurg based on LAA on EKG  Surgical History   Surgical History   No past medical history on file.    Other Surgical History   Procedure Laterality Date [place]  Provider  COLONOSCOPY WITH PROPOFOL N/A 02/04/2016 Procedure: COLONOSCOPY WITH PROPOFOL; Surgeon: Lucilla Lame, MD; Location: [place]; Service: Endoscopy; Laterality: N/A; Provider  [place]  Provider  POLYPECTOMY  02/04/2016 Procedure: POLYPECTOMY; Surgeon: Lucilla Lame, MD; Location: [place]; Service: Endoscopy;; Provider  TONSILLECTOMY  1960  Provider  TONSILLECTOMY    Provider    Performing Technologist/Nurse   Performing Technologist/Nurse: Gabriel Cirri                    Implants     No active implants to display in this view.  Order-Level Documents:   There are no order-level  documents.  Encounter-Level Documents - 03/10/2017:   Scan on 03/11/2017 2:42 PM by Default, Provider, MD  Electronic signature on 03/10/2017 10:35 AM      Signed   Electronically signed by Nori Riis, PA-C on 03/10/17 at 1455 EDT  Printable Result Report   Result Report   External Result Report   External Result Report    Patient Result Comments   Viewed by Hilton Sinclair on 03/16/2017 7:44 PM  Written by Arnetha Courser, MD on 03/16/2017 5:02 PM  Greetings. Here are the results of your echocardiogram. The term "grade 1 diastolic dysfunction" is a fairly common finding, which means the heart takes a little longer to relax after it squeezes. This is not uncommon in individuals 65 or over. You also have mild regurgitation or back flow of blood at the mitral valve (the swinging doors on the left side of your heart). Overall, there is nothing worrisome about your heart structurally. If you have specific questions or want to discuss, I can see you in the office or you can write or call. [place], Dr. Sanda Klein

## 2017-10-12 MED ORDER — CEFAZOLIN SODIUM-DEXTROSE 2-4 GM/100ML-% IV SOLN
2.0000 g | INTRAVENOUS | Status: AC
  Administered 2017-10-13: 2 g via INTRAVENOUS

## 2017-10-13 ENCOUNTER — Ambulatory Visit
Admission: RE | Admit: 2017-10-13 | Discharge: 2017-10-13 | Disposition: A | Discharge status: Home / Self Care | Payer: Medicare Other | Source: Ambulatory Visit | Attending: Surgery | Admitting: Surgery

## 2017-10-13 ENCOUNTER — Ambulatory Visit: Payer: Medicare Other | Admitting: Anesthesiology

## 2017-10-13 ENCOUNTER — Encounter
Admission: RE | Disposition: A | Discharge status: Home / Self Care | Payer: Self-pay | Source: Ambulatory Visit | Attending: Surgery

## 2017-10-13 ENCOUNTER — Encounter: Payer: Self-pay | Admitting: *Deleted

## 2017-10-13 DIAGNOSIS — M8580 Other specified disorders of bone density and structure, unspecified site: Secondary | ICD-10-CM | POA: Diagnosis not present

## 2017-10-13 DIAGNOSIS — Z7982 Long term (current) use of aspirin: Secondary | ICD-10-CM | POA: Diagnosis not present

## 2017-10-13 DIAGNOSIS — Z86010 Personal history of colonic polyps: Secondary | ICD-10-CM | POA: Diagnosis not present

## 2017-10-13 DIAGNOSIS — K8010 Calculus of gallbladder with chronic cholecystitis without obstruction: Secondary | ICD-10-CM | POA: Diagnosis not present

## 2017-10-13 DIAGNOSIS — K8020 Calculus of gallbladder without cholecystitis without obstruction: Secondary | ICD-10-CM | POA: Diagnosis not present

## 2017-10-13 DIAGNOSIS — Z79899 Other long term (current) drug therapy: Secondary | ICD-10-CM | POA: Insufficient documentation

## 2017-10-13 DIAGNOSIS — E039 Hypothyroidism, unspecified: Secondary | ICD-10-CM | POA: Insufficient documentation

## 2017-10-13 DIAGNOSIS — E785 Hyperlipidemia, unspecified: Secondary | ICD-10-CM | POA: Insufficient documentation

## 2017-10-13 HISTORY — PX: CHOLECYSTECTOMY: SHX55

## 2017-10-13 SURGERY — LAPAROSCOPIC CHOLECYSTECTOMY
Anesthesia: General

## 2017-10-13 MED ORDER — SEVOFLURANE IN SOLN
RESPIRATORY_TRACT | Status: AC
  Filled 2017-10-13: qty 250

## 2017-10-13 MED ORDER — FENTANYL CITRATE (PF) 100 MCG/2ML IJ SOLN
INTRAMUSCULAR | Status: AC
  Filled 2017-10-13: qty 2

## 2017-10-13 MED ORDER — MIDAZOLAM HCL 2 MG/2ML IJ SOLN
INTRAMUSCULAR | Status: AC
  Filled 2017-10-13: qty 2

## 2017-10-13 MED ORDER — SUGAMMADEX SODIUM 200 MG/2ML IV SOLN
INTRAVENOUS | Status: AC
  Filled 2017-10-13: qty 2

## 2017-10-13 MED ORDER — FENTANYL CITRATE (PF) 100 MCG/2ML IJ SOLN
INTRAMUSCULAR | Status: DC | PRN
  Administered 2017-10-13 (×3): 50 ug via INTRAVENOUS

## 2017-10-13 MED ORDER — BUPIVACAINE-EPINEPHRINE (PF) 0.25% -1:200000 IJ SOLN
INTRAMUSCULAR | Status: AC
  Filled 2017-10-13: qty 30

## 2017-10-13 MED ORDER — BUPIVACAINE-EPINEPHRINE 0.25% -1:200000 IJ SOLN
INTRAMUSCULAR | Status: DC | PRN
  Administered 2017-10-13: 30 mL

## 2017-10-13 MED ORDER — MIDAZOLAM HCL 2 MG/2ML IJ SOLN
INTRAMUSCULAR | Status: DC | PRN
  Administered 2017-10-13: 2 mg via INTRAVENOUS

## 2017-10-13 MED ORDER — LACTATED RINGERS IV SOLN
INTRAVENOUS | Status: DC
  Administered 2017-10-13 (×3): via INTRAVENOUS

## 2017-10-13 MED ORDER — SUGAMMADEX SODIUM 500 MG/5ML IV SOLN
INTRAVENOUS | Status: DC | PRN
  Administered 2017-10-13: 120 mg via INTRAVENOUS

## 2017-10-13 MED ORDER — CEFAZOLIN SODIUM-DEXTROSE 2-4 GM/100ML-% IV SOLN
INTRAVENOUS | Status: AC
  Filled 2017-10-13: qty 100

## 2017-10-13 MED ORDER — FAMOTIDINE 20 MG PO TABS
ORAL_TABLET | ORAL | Status: AC
  Administered 2017-10-13: 20 mg via ORAL
  Filled 2017-10-13: qty 1

## 2017-10-13 MED ORDER — HYDROCODONE-ACETAMINOPHEN 5-325 MG PO TABS: 1.0000 | ORAL_TABLET | Freq: Four times a day (QID) | ORAL | 0 refills | Status: DC | PRN

## 2017-10-13 MED ORDER — LIDOCAINE HCL (CARDIAC) 20 MG/ML IV SOLN
INTRAVENOUS | Status: DC | PRN
  Administered 2017-10-13: 60 mg via INTRAVENOUS

## 2017-10-13 MED ORDER — ROCURONIUM BROMIDE 100 MG/10ML IV SOLN
INTRAVENOUS | Status: DC | PRN
  Administered 2017-10-13: 40 mg via INTRAVENOUS

## 2017-10-13 MED ORDER — ONDANSETRON HCL 4 MG/2ML IJ SOLN: 4.0000 mg | Freq: Once | INTRAMUSCULAR | Status: DC | PRN

## 2017-10-13 MED ORDER — ACETAMINOPHEN 10 MG/ML IV SOLN
INTRAVENOUS | Status: DC | PRN
  Administered 2017-10-13: 1000 mg via INTRAVENOUS

## 2017-10-13 MED ORDER — ACETAMINOPHEN 10 MG/ML IV SOLN
INTRAVENOUS | Status: AC
  Filled 2017-10-13: qty 100

## 2017-10-13 MED ORDER — ONDANSETRON HCL 4 MG/2ML IJ SOLN
INTRAMUSCULAR | Status: AC
Start: 2017-10-13 — End: 2017-10-13
  Filled 2017-10-13: qty 2

## 2017-10-13 MED ORDER — PROPOFOL 10 MG/ML IV BOLUS
INTRAVENOUS | Status: DC | PRN
  Administered 2017-10-13: 150 mg via INTRAVENOUS

## 2017-10-13 MED ORDER — DEXAMETHASONE SODIUM PHOSPHATE 10 MG/ML IJ SOLN
INTRAMUSCULAR | Status: DC | PRN
  Administered 2017-10-13: 10 mg via INTRAVENOUS

## 2017-10-13 MED ORDER — ROCURONIUM BROMIDE 50 MG/5ML IV SOLN
INTRAVENOUS | Status: AC
  Filled 2017-10-13: qty 1

## 2017-10-13 MED ORDER — FAMOTIDINE 20 MG PO TABS
20.0000 mg | ORAL_TABLET | Freq: Once | ORAL | Status: AC
  Administered 2017-10-13: 20 mg via ORAL

## 2017-10-13 MED ORDER — CHLORHEXIDINE GLUCONATE CLOTH 2 % EX PADS: 6.0000 | MEDICATED_PAD | Freq: Once | CUTANEOUS | Status: DC

## 2017-10-13 MED ORDER — ONDANSETRON HCL 4 MG/2ML IJ SOLN
INTRAMUSCULAR | Status: DC | PRN
  Administered 2017-10-13: 4 mg via INTRAVENOUS

## 2017-10-13 MED ORDER — FENTANYL CITRATE (PF) 100 MCG/2ML IJ SOLN: 25.0000 ug | INTRAMUSCULAR | Status: DC | PRN

## 2017-10-13 MED ORDER — PROPOFOL 10 MG/ML IV BOLUS
INTRAVENOUS | Status: AC
  Filled 2017-10-13: qty 20

## 2017-10-13 MED ORDER — CHLORHEXIDINE GLUCONATE CLOTH 2 % EX PADS
6.0000 | MEDICATED_PAD | Freq: Once | CUTANEOUS | Status: AC
  Administered 2017-10-13: 6 via TOPICAL

## 2017-10-13 MED ORDER — SUCCINYLCHOLINE CHLORIDE 20 MG/ML IJ SOLN
INTRAMUSCULAR | Status: AC
  Filled 2017-10-13: qty 1

## 2017-10-13 MED ORDER — KETOROLAC TROMETHAMINE 30 MG/ML IJ SOLN
INTRAMUSCULAR | Status: DC | PRN
  Administered 2017-10-13: 30 mg via INTRAVENOUS

## 2017-10-13 SURGICAL SUPPLY — 45 items
APPLICATOR COTTON TIP 6IN STRL (MISCELLANEOUS) ×2 IMPLANT
APPLIER CLIP 5 13 M/L LIGAMAX5 (MISCELLANEOUS) ×2
BLADE SURG 15 STRL LF DISP TIS (BLADE) ×1 IMPLANT
BLADE SURG 15 STRL SS (BLADE) ×1
CANISTER SUCT 1200ML W/VALVE (MISCELLANEOUS) ×2 IMPLANT
CHLORAPREP W/TINT 26ML (MISCELLANEOUS) ×2 IMPLANT
CHOLANGIOGRAM CATH TAUT (CATHETERS) IMPLANT
CLEANER CAUTERY TIP 5X5 PAD (MISCELLANEOUS) IMPLANT
CLIP APPLIE 5 13 M/L LIGAMAX5 (MISCELLANEOUS) ×1 IMPLANT
DECANTER SPIKE VIAL GLASS SM (MISCELLANEOUS) IMPLANT
DERMABOND ADVANCED (GAUZE/BANDAGES/DRESSINGS) ×1
DERMABOND ADVANCED .7 DNX12 (GAUZE/BANDAGES/DRESSINGS) ×1 IMPLANT
DRAPE C-ARM XRAY 36X54 (DRAPES) IMPLANT
ELECT CAUTERY BLADE 6.4 (BLADE) ×2 IMPLANT
ELECT REM PT RETURN 9FT ADLT (ELECTROSURGICAL) ×2
ELECTRODE REM PT RTRN 9FT ADLT (ELECTROSURGICAL) ×1 IMPLANT
GLOVE BIO SURGEON STRL SZ7 (GLOVE) ×2 IMPLANT
GOWN STRL REUS W/ TWL LRG LVL3 (GOWN DISPOSABLE) ×3 IMPLANT
GOWN STRL REUS W/TWL LRG LVL3 (GOWN DISPOSABLE) ×3
IRRIGATION STRYKERFLOW (MISCELLANEOUS) ×1 IMPLANT
IRRIGATOR STRYKERFLOW (MISCELLANEOUS) ×2
IV CATH ANGIO 12GX3 LT BLUE (NEEDLE) ×2 IMPLANT
IV NS 1000ML (IV SOLUTION) ×1
IV NS 1000ML BAXH (IV SOLUTION) ×1 IMPLANT
L-HOOK LAP DISP 36CM (ELECTROSURGICAL) ×2
LHOOK LAP DISP 36CM (ELECTROSURGICAL) ×1 IMPLANT
NEEDLE HYPO 22GX1.5 SAFETY (NEEDLE) ×2 IMPLANT
PACK LAP CHOLECYSTECTOMY (MISCELLANEOUS) ×2 IMPLANT
PAD CLEANER CAUTERY TIP 5X5 (MISCELLANEOUS)
PENCIL ELECTRO HAND CTR (MISCELLANEOUS) ×2 IMPLANT
POUCH SPECIMEN RETRIEVAL 10MM (ENDOMECHANICALS) ×2 IMPLANT
SCISSORS METZENBAUM CVD 33 (INSTRUMENTS) ×2 IMPLANT
SLEEVE ENDOPATH XCEL 5M (ENDOMECHANICALS) ×4 IMPLANT
SOL ANTI-FOG 6CC FOG-OUT (MISCELLANEOUS) ×1 IMPLANT
SOL FOG-OUT ANTI-FOG 6CC (MISCELLANEOUS) ×1
SPONGE LAP 18X18 5 PK (GAUZE/BANDAGES/DRESSINGS) ×2 IMPLANT
STOPCOCK 4 WAY LG BORE MALE ST (IV SETS) IMPLANT
SUT ETHIBOND 0 MO6 C/R (SUTURE) IMPLANT
SUT MNCRL AB 4-0 PS2 18 (SUTURE) ×2 IMPLANT
SUT VICRYL 0 AB UR-6 (SUTURE) ×4 IMPLANT
SYR 20CC LL (SYRINGE) ×2 IMPLANT
TROCAR XCEL BLUNT TIP 100MML (ENDOMECHANICALS) ×2 IMPLANT
TROCAR XCEL NON-BLD 5MMX100MML (ENDOMECHANICALS) ×2 IMPLANT
TUBING INSUFFLATOR HI FLOW (MISCELLANEOUS) ×2 IMPLANT
WATER STERILE IRR 1000ML POUR (IV SOLUTION) ×2 IMPLANT

## 2017-10-13 NOTE — Anesthesia Procedure Notes (Addendum)
Procedure Name: Intubation Date/Time: 10/13/2017 2:25 PM Performed by: Allean Found Pre-anesthesia Checklist: Patient identified, Emergency Drugs available, Suction available, Patient being monitored and Timeout performed Patient Re-evaluated:Patient Re-evaluated prior to induction Oxygen Delivery Method: Circle system utilized Preoxygenation: Pre-oxygenation with 100% oxygen Induction Type: IV induction Ventilation: Mask ventilation without difficulty Laryngoscope Size: Mac, 3 and McGraph Grade View: Grade II Tube type: Oral Tube size: 7.0 mm Number of attempts: 2 Airway Equipment and Method: Stylet Placement Confirmation: ETT inserted through vocal cords under direct vision,  positive ETCO2 and breath sounds checked- equal and bilateral Secured at: 21 cm Tube secured with: Tape Dental Injury: Teeth and Oropharynx as per pre-operative assessment  Difficulty Due To: Difficult Airway- due to anterior larynx Comments: MAC 3: could visualize epiglottis, not cords  Changed to [place].  Success with good view. + BSBE

## 2017-10-13 NOTE — Interval H&P Note (Signed)
History and Physical Interval Note:  10/13/2017 12:56 PM  Lynn Sandoval  has presented today for surgery, with the diagnosis of Cholelithiasis  The various methods of treatment have been discussed with the patient and family. After consideration of risks, benefits and other options for treatment, the patient has consented to  Procedure(s): LAPAROSCOPIC CHOLECYSTECTOMY (N/A) as a surgical intervention .  The patient's history has been reviewed, patient examined, no change in status, stable for surgery.  I have reviewed the patient's chart and labs.  Questions were answered to the patient's satisfaction.     [place]

## 2017-10-13 NOTE — H&P (View-Only) (Signed)
Patient ID: Lynn Sandoval, female   DOB: September 18, 1951, 66 y.o.   MRN: [id number]  HPI Lynn Sandoval is a 66 y.o. female seen in consultation at the request of Dr. Steva Ready. She reports having episodes of RUQ pain. She reports that there is no specific aggravating or aggravating factors. She experiences right upper quadrant pain that is sharp and moderate in severity. As you nausea. No vomiting no fevers or chills no evidence of cholangitis or obstructive jaundice. She does have a family history positive for gallbladder disease that have responded to cholecystectomy in the past. She is able to perform more than 4 Mets without any shortness of breath or chest pain. LFTs are completely normal. Ultrasound have personally reviewed it. There is evidence of cholelithiasis. Normal common bile duct no evidence of cholecystitis.  HPI  Past Medical History:  Diagnosis Date  . Arthritis    fingers  . Colon polyps   . Family history of adverse reaction to anesthesia    son - slow to wake  . H/O dysplastic nevus 02/13/2017   Followed by Dr. Brendolyn Patty  . Hyperlipidemia   . Hypothyroidism   . Motion sickness    boats  . Osteopenia 02/25/2017   DEXA Feb 2018  . Thyroid disease     Past Surgical History:  Procedure Laterality Date  . [place]  . COLONOSCOPY WITH PROPOFOL N/A 02/04/2016   Procedure: COLONOSCOPY WITH PROPOFOL;  Surgeon: Lucilla Lame, MD;  Location: [place];  Service: Endoscopy;  Laterality: N/A;  . EYE SURGERY  1955  . POLYPECTOMY  02/04/2016   Procedure: POLYPECTOMY;  Surgeon: Lucilla Lame, MD;  Location: [place];  Service: Endoscopy;;  . TONSILLECTOMY  1960  . TONSILLECTOMY      Family History  Problem Relation Age of Onset  . Depression Mother   . Hearing loss Mother   . Hyperlipidemia Mother   . Hypertension Mother   . Glaucoma Mother   . Cancer Mother        melanoma  . Heart disease Father   . Heart attack Father   . Diabetes  Maternal Grandmother   . Heart attack Maternal Grandmother   . Cancer Paternal Grandmother        colon  . Breast cancer Neg Hx     Social History Social History  Substance Use Topics  . Smoking status: Never Smoker  . Smokeless tobacco: Never Used  . Alcohol use 1.8 oz/week    3 Glasses of wine per week     Comment: ocassional glass of wine    No Known Allergies  Current Outpatient Prescriptions  Medication Sig Dispense Refill  . ASPIRIN 81 PO Take 1 tablet by mouth daily.    Marland Kitchen atorvastatin (LIPITOR) 20 MG tablet Take 1 tablet (20 mg total) by mouth at bedtime. 90 tablet 3  . CALCIUM PO Take by mouth.    . levothyroxine (SYNTHROID, LEVOTHROID) 75 MCG tablet Take 1 tablet (75 mcg total) by mouth daily before breakfast. 90 tablet 3  . Omega-3 Fatty Acids (FISH OIL PO) Take by mouth.     No current facility-administered medications for this visit.      Review of Systems Full ROS  was asked and was negative except for the information on the HPI  Physical Exam Blood pressure (!) 146/79, pulse 86, temperature 98.4 F (36.9 C), temperature source Oral, height 5\' 4"  (1.626 m), weight 70.7 kg (155 lb 12.8 oz). CONSTITUTIONAL: NAD  EYES: Pupils are equal, round, a Sclera are non-icteric. EARS, NOSE, MOUTH AND THROAT: The oropharynx is clear. The oral mucosa is pink and moist. Hearing is intact to voice. LYMPH NODES:  Lymph nodes in the neck are normal. RESPIRATORY:  Lungs are clear. There is normal respiratory effort, with equal breath sounds bilaterally, and without pathologic use of accessory muscles. CARDIOVASCULAR: Heart is regular without murmurs, gallops, or rubs. GI: The abdomen is soft, nontender, and nondistended. There are no palpable masses. There is no hepatosplenomegaly. There are normal bowel sounds in all quadrants. GU: Rectal deferred.   MUSCULOSKELETAL: Normal muscle strength and tone. No cyanosis or edema.   SKIN: Turgor is good and there are no pathologic skin  lesions or ulcers. NEUROLOGIC: Motor and sensation is grossly normal. Cranial nerves are grossly intact. PSYCH:  Oriented to person, place and time. Affect is normal.  Data Reviewed  I have personally reviewed the patient's imaging, laboratory findings and medical records.    Assessment / Plan 66 year old female with a history of symptomatic cholelithiasis. Discussed with the patient in detail about her disease process and my recommendation for cholecystectomy.The risks, benefits, complications, treatment options, and expected outcomes were discussed with the patient. The possibilities of bleeding, recurrent infection, finding a normal gallbladder, perforation of viscus organs, damage to surrounding structures, bile leak, abscess formation, needing a drain placed, the need for additional procedures, reaction to medication, pulmonary aspiration,  failure to diagnose a condition, the possible need to convert to an open procedure, and creating a complication requiring transfusion or operation were discussed with the patient. The patient and/or family concurred with the proposed plan, giving informed consent.  Operative this report will be sent to the referring provider  Caroleen Hamman, MD FACS General Surgeon 09/28/2017, 3:04 PM

## 2017-10-13 NOTE — Discharge Instructions (Signed)

## 2017-10-13 NOTE — Anesthesia Post-op Follow-up Note (Signed)
Anesthesia QCDR form completed.        

## 2017-10-13 NOTE — Anesthesia Postprocedure Evaluation (Signed)
Anesthesia Post Note  Patient: Lynn Sandoval  Procedure(s) Performed: LAPAROSCOPIC CHOLECYSTECTOMY (N/A )  Patient location during evaluation: PACU Anesthesia Type: General Level of consciousness: awake and alert and oriented Pain management: pain level controlled Vital Signs Assessment: post-procedure vital signs reviewed and stable Respiratory status: spontaneous breathing, nonlabored ventilation and respiratory function stable Cardiovascular status: blood pressure returned to baseline and stable Postop Assessment: no signs of nausea or vomiting Anesthetic complications: no     Last Vitals:  Vitals:   10/13/17 1610 10/13/17 1625  BP: 126/73 (!) 145/60  Pulse:  (!) 57  Resp:  16  Temp: 36.7 C (!) 36.3 C  SpO2:  99%    Last Pain:  Vitals:   10/13/17 1625  TempSrc: Temporal  PainSc: 1                  [name]

## 2017-10-13 NOTE — Transfer of Care (Signed)
Immediate Anesthesia Transfer of Care Note  Patient: Lynn Sandoval  Procedure(s) Performed: LAPAROSCOPIC CHOLECYSTECTOMY (N/A )  Patient Location: PACU  Anesthesia Type:General  Level of Consciousness: awake  Airway & Oxygen Therapy: Patient Spontanous Breathing and Patient connected to face mask oxygen  Post-op Assessment: Report given to RN and Post -op Vital signs reviewed and stable  Post vital signs: Reviewed and stable  Last Vitals:  Vitals:   10/13/17 1135  BP: (!) 163/71  Pulse: 64  Resp: 16  Temp: 36.7 C  SpO2: 100%    Last Pain:  Vitals:   10/13/17 1135  TempSrc: Tympanic         Complications: No apparent anesthesia complications

## 2017-10-13 NOTE — Op Note (Signed)
Laparoscopic Cholecystectomy  Pre-operative Diagnosis: symptomatic GS  Post-operative Diagnosis: Same  Procedure: lap cholecystectomy  Surgeon: Caroleen Hamman, MD FACS  Anesthesia: Gen. with endotracheal tube  Findings: GB  Estimated Blood Loss: 10cc                 Specimens: Gallbladder           Complications: none   Procedure Details  The patient was seen again in the Holding Room. The benefits, complications, treatment options, and expected outcomes were discussed with the patient. The risks of bleeding, infection, recurrence of symptoms, failure to resolve symptoms, bile duct damage, bile duct leak, retained common bile duct stone, bowel injury, any of which could require further surgery and/or ERCP, stent, or papillotomy were reviewed with the patient. The likelihood of improving the patient's symptoms with return to their baseline status is good.  The patient and/or family concurred with the proposed plan, giving informed consent.  The patient was taken to Operating Room, identified as Lynn Sandoval and the procedure verified as Laparoscopic Cholecystectomy.  A Time Out was held and the above information confirmed.  Prior to the induction of general anesthesia, antibiotic prophylaxis was administered. VTE prophylaxis was in place. General endotracheal anesthesia was then administered and tolerated well. After the induction, the abdomen was prepped with Chloraprep and draped in the sterile fashion. The patient was positioned in the supine position.  Cut down technique was used to enter the abdominal cavity and a Hasson trochar was placed after two vicryl stitches were anchored to the fascia. Pneumoperitoneum was then created with CO2 and tolerated well without any adverse changes in the patient's vital signs.  Three 5-mm ports were placed in the right upper quadrant all under direct vision. All skin incisions  were infiltrated with a local anesthetic agent before making the  incision and placing the trocars.   The patient was positioned  in reverse Trendelenburg, tilted slightly to the patient's left.  The gallbladder was identified, the fundus grasped and retracted cephalad. Adhesions were lysed bluntly. The infundibulum was grasped and retracted laterally, exposing the peritoneum overlying the triangle of Calot. This was then divided and exposed in a blunt fashion. An extended critical view of the cystic duct and cystic artery was obtained.  The cystic duct was clearly identified and bluntly dissected.   Artery and duct were double clipped and divided.  The gallbladder was taken from the gallbladder fossa in a retrograde fashion with the electrocautery. The gallbladder was removed and placed in an Endocatch bag. The liver bed was irrigated and inspected. Hemostasis was achieved with the electrocautery. Copious irrigation was utilized and was repeatedly aspirated until clear.  The gallbladder and Endocatch sac were then removed through a port site.    Inspection of the right upper quadrant was performed. No bleeding, bile duct injury or leak, or bowel injury was noted. Pneumoperitoneum was released.  The periumbilical port site was closed with interrumpted 0 Vicryl sutures. 4-0 subcuticular Monocryl was used to close the skin. Dermabond was  applied.  The patient was then extubated and brought to the recovery room in stable condition. Sponge, lap, and needle counts were correct at closure and at the conclusion of the case.               Caroleen Hamman, MD, FACS

## 2017-10-13 NOTE — Anesthesia Preprocedure Evaluation (Signed)
Anesthesia Evaluation  Patient identified by MRN, date of birth, ID band Patient awake    Reviewed: Allergy & Precautions, NPO status , Patient's Chart, lab work & pertinent test results  Airway Mallampati: II       Dental  (+) Teeth Intact   Pulmonary neg pulmonary ROS,    breath sounds clear to auscultation       Cardiovascular Exercise Tolerance: Good negative cardio ROS   Rhythm:Regular Rate:Normal     Neuro/Psych negative neurological ROS     GI/Hepatic negative GI ROS, Neg liver ROS, (+)     (-) substance abuse  ,   Endo/Other  Hypothyroidism   Renal/GU   negative genitourinary   Musculoskeletal   Abdominal Normal abdominal exam  (+)   Peds negative pediatric ROS (+)  Hematology negative hematology ROS (+)   Anesthesia Other Findings   Reproductive/Obstetrics                             Anesthesia Physical Anesthesia Plan  ASA: II  Anesthesia Plan: General   Post-op Pain Management:    Induction: Intravenous  PONV Risk Score and Plan: 1 and Ondansetron and Dexamethasone  Airway Management Planned: Oral ETT  Additional Equipment:   Intra-op Plan:   Post-operative Plan: Extubation in OR  Informed Consent: I have reviewed the patients History and Physical, chart, labs and discussed the procedure including the risks, benefits and alternatives for the proposed anesthesia with the patient or authorized representative who has indicated his/her understanding and acceptance.     Plan Discussed with: CRNA  Anesthesia Plan Comments:         Anesthesia Quick Evaluation

## 2017-10-14 ENCOUNTER — Telehealth: Payer: Self-pay

## 2017-10-14 ENCOUNTER — Encounter: Payer: Self-pay | Admitting: Surgery

## 2017-10-14 NOTE — Telephone Encounter (Signed)
Post-op call made to patient at this time. Spoke with Lynn Sandoval. Post-op interview questions below.  1. How are you feeling? Having some soreness but feeling okay  2. Is your pain controlled? Not having any pain just sore  3. What are you doing for the pain? N/A  4. Are you having any Nausea or Vomiting? None  5. Are you having any Fever or Chills? None  6. Are you having any Constipation or Diarrhea? Has yet to have a bowel movement has taken some Miralax this morning to try to help.   7. Is there any Swelling or Bruising you are concerned about? None  8. Do you have any questions or concerns at this time? None   Discussion: Reminded of post-op appointment on 10/29 at 9:45AM. Advised that if she has any questions or concerns prior to this appointment to give our office a call.Patient verbalized understanding.

## 2017-10-15 LAB — SURGICAL PATHOLOGY

## 2017-10-22 ENCOUNTER — Encounter: Payer: Self-pay | Admitting: Surgery

## 2017-10-22 ENCOUNTER — Ambulatory Visit (INDEPENDENT_AMBULATORY_CARE_PROVIDER_SITE_OTHER): Payer: Medicare Other | Admitting: Surgery

## 2017-10-22 VITALS — BP 150/88 | HR 76 | Temp 98.4°F | Ht 64.0 in | Wt 155.0 lb

## 2017-10-22 DIAGNOSIS — Z09 Encounter for follow-up examination after completed treatment for conditions other than malignant neoplasm: Secondary | ICD-10-CM

## 2017-10-22 NOTE — Progress Notes (Signed)
S/p lap chole  Doing great  No pain + PO Path d/w pt in detail  PE NAD Abd: incisions c/d/i, no infection or peritonitis  A/p doing very well after lap chole RTC prn No heavy lifitng

## 2017-10-22 NOTE — Patient Instructions (Signed)

## 2017-12-03 ENCOUNTER — Other Ambulatory Visit: Payer: Self-pay | Admitting: Family Medicine

## 2017-12-03 NOTE — Telephone Encounter (Signed)
She should not need another refill of statin until Feb 2019

## 2017-12-25 ENCOUNTER — Other Ambulatory Visit: Payer: Self-pay | Admitting: Family Medicine

## 2017-12-25 DIAGNOSIS — E039 Hypothyroidism, unspecified: Secondary | ICD-10-CM

## 2017-12-25 NOTE — Telephone Encounter (Signed)
Lab Results  Component Value Date   TSH 1.84 03/26/2017   She won't run out of current Rx until early Feb or late January; one more 90 day supply sent to cover, but she'll need labs in late March or early April

## 2018-01-01 ENCOUNTER — Other Ambulatory Visit: Payer: Self-pay | Admitting: Family Medicine

## 2018-01-01 NOTE — Telephone Encounter (Signed)
Last SGPT and lipids reviewed Rx approved 

## 2018-01-04 DIAGNOSIS — Z1283 Encounter for screening for malignant neoplasm of skin: Secondary | ICD-10-CM | POA: Diagnosis not present

## 2018-01-04 DIAGNOSIS — Z808 Family history of malignant neoplasm of other organs or systems: Secondary | ICD-10-CM | POA: Diagnosis not present

## 2018-01-04 DIAGNOSIS — L821 Other seborrheic keratosis: Secondary | ICD-10-CM | POA: Diagnosis not present

## 2018-01-04 DIAGNOSIS — C439 Malignant melanoma of skin, unspecified: Secondary | ICD-10-CM

## 2018-01-04 DIAGNOSIS — D485 Neoplasm of uncertain behavior of skin: Secondary | ICD-10-CM | POA: Diagnosis not present

## 2018-01-04 DIAGNOSIS — D0361 Melanoma in situ of right upper limb, including shoulder: Secondary | ICD-10-CM | POA: Diagnosis not present

## 2018-01-04 DIAGNOSIS — L820 Inflamed seborrheic keratosis: Secondary | ICD-10-CM | POA: Diagnosis not present

## 2018-01-04 HISTORY — DX: Malignant melanoma of skin, unspecified: C43.9

## 2018-01-12 ENCOUNTER — Other Ambulatory Visit: Payer: Self-pay | Admitting: Family Medicine

## 2018-01-12 DIAGNOSIS — Z1231 Encounter for screening mammogram for malignant neoplasm of breast: Secondary | ICD-10-CM

## 2018-01-29 HISTORY — PX: SKIN SURGERY: SHX2413

## 2018-02-01 DIAGNOSIS — D0361 Melanoma in situ of right upper limb, including shoulder: Secondary | ICD-10-CM | POA: Diagnosis not present

## 2018-02-01 DIAGNOSIS — D039 Melanoma in situ, unspecified: Secondary | ICD-10-CM | POA: Insufficient documentation

## 2018-02-01 HISTORY — PX: MELANOMA EXCISION: SHX5266

## 2018-02-08 DIAGNOSIS — D0361 Melanoma in situ of right upper limb, including shoulder: Secondary | ICD-10-CM | POA: Diagnosis not present

## 2018-02-15 ENCOUNTER — Encounter: Payer: Medicare Other | Admitting: Family Medicine

## 2018-02-16 ENCOUNTER — Ambulatory Visit
Admission: RE | Admit: 2018-02-16 | Discharge: 2018-02-16 | Disposition: A | Discharge status: Home / Self Care | Payer: Medicare Other | Source: Ambulatory Visit | Attending: Family Medicine | Admitting: Family Medicine

## 2018-02-16 DIAGNOSIS — Z1231 Encounter for screening mammogram for malignant neoplasm of breast: Secondary | ICD-10-CM | POA: Diagnosis not present

## 2018-02-26 HISTORY — PX: APPENDECTOMY: SHX54

## 2018-03-09 ENCOUNTER — Other Ambulatory Visit: Payer: Self-pay | Admitting: Family Medicine

## 2018-03-09 DIAGNOSIS — E039 Hypothyroidism, unspecified: Secondary | ICD-10-CM

## 2018-03-09 NOTE — Telephone Encounter (Signed)
Last TSH normal appt later in March, check TSH then Rx approved

## 2018-03-12 ENCOUNTER — Observation Stay
Admission: EM | Admit: 2018-03-12 | Discharge: 2018-03-13 | Disposition: A | Discharge status: Home / Self Care | Payer: Medicare Other | Attending: Surgery | Admitting: Surgery

## 2018-03-12 ENCOUNTER — Encounter: Payer: Self-pay | Admitting: Emergency Medicine

## 2018-03-12 DIAGNOSIS — Z7982 Long term (current) use of aspirin: Secondary | ICD-10-CM | POA: Insufficient documentation

## 2018-03-12 DIAGNOSIS — E785 Hyperlipidemia, unspecified: Secondary | ICD-10-CM | POA: Diagnosis not present

## 2018-03-12 DIAGNOSIS — R1031 Right lower quadrant pain: Secondary | ICD-10-CM | POA: Diagnosis not present

## 2018-03-12 DIAGNOSIS — Z79899 Other long term (current) drug therapy: Secondary | ICD-10-CM | POA: Diagnosis not present

## 2018-03-12 DIAGNOSIS — R1030 Lower abdominal pain, unspecified: Secondary | ICD-10-CM | POA: Diagnosis not present

## 2018-03-12 DIAGNOSIS — M8580 Other specified disorders of bone density and structure, unspecified site: Secondary | ICD-10-CM | POA: Diagnosis not present

## 2018-03-12 DIAGNOSIS — R1032 Left lower quadrant pain: Secondary | ICD-10-CM | POA: Diagnosis not present

## 2018-03-12 DIAGNOSIS — Z86010 Personal history of colonic polyps: Secondary | ICD-10-CM | POA: Insufficient documentation

## 2018-03-12 DIAGNOSIS — R109 Unspecified abdominal pain: Secondary | ICD-10-CM | POA: Diagnosis not present

## 2018-03-12 DIAGNOSIS — Z79890 Hormone replacement therapy (postmenopausal): Secondary | ICD-10-CM | POA: Insufficient documentation

## 2018-03-12 DIAGNOSIS — Z8249 Family history of ischemic heart disease and other diseases of the circulatory system: Secondary | ICD-10-CM | POA: Diagnosis not present

## 2018-03-12 DIAGNOSIS — I517 Cardiomegaly: Secondary | ICD-10-CM | POA: Diagnosis not present

## 2018-03-12 DIAGNOSIS — K3580 Unspecified acute appendicitis: Secondary | ICD-10-CM | POA: Diagnosis not present

## 2018-03-12 DIAGNOSIS — E039 Hypothyroidism, unspecified: Secondary | ICD-10-CM | POA: Insufficient documentation

## 2018-03-12 DIAGNOSIS — R110 Nausea: Secondary | ICD-10-CM | POA: Diagnosis not present

## 2018-03-12 HISTORY — DX: Malignant (primary) neoplasm, unspecified: C80.1

## 2018-03-12 LAB — COMPREHENSIVE METABOLIC PANEL
ALT: 42 U/L (ref 14–54)
AST: 32 U/L (ref 15–41)
Albumin: 4.6 g/dL (ref 3.5–5.0)
Alkaline Phosphatase: 136 U/L — ABNORMAL HIGH (ref 38–126)
Anion gap: 12 (ref 5–15)
BUN: 15 mg/dL (ref 6–20)
CO2: 21 mmol/L — ABNORMAL LOW (ref 22–32)
Calcium: 8.8 mg/dL — ABNORMAL LOW (ref 8.9–10.3)
Chloride: 101 mmol/L (ref 101–111)
Creatinine, Ser: 0.71 mg/dL (ref 0.44–1.00)
GFR calc Af Amer: >60 mL/min (ref >60)
GFR calc non Af Amer: >60 mL/min (ref >60)
Glucose, Bld: 106 mg/dL — ABNORMAL HIGH (ref 65–99)
Potassium: 3.7 mmol/L (ref 3.5–5.1)
Sodium: 134 mmol/L — ABNORMAL LOW (ref 135–145)
Total Bilirubin: 1.3 mg/dL — ABNORMAL HIGH (ref 0.3–1.2)
Total Protein: 7.3 g/dL (ref 6.5–8.1)

## 2018-03-12 LAB — LIPASE, BLOOD: Lipase: 24 U/L (ref 11–51)

## 2018-03-12 LAB — CBC
HCT: 42.7 % (ref 35.0–47.0)
Hemoglobin: 14.7 g/dL (ref 12.0–16.0)
MCH: 28.3 pg (ref 26.0–34.0)
MCHC: 34.5 g/dL (ref 32.0–36.0)
MCV: 82.2 fL (ref 80.0–100.0)
Platelets: 272 10*3/uL (ref 150–440)
RBC: 5.2 MIL/uL (ref 3.80–5.20)
RDW: 13.1 % (ref 11.5–14.5)
WBC: 12.8 10*3/uL — ABNORMAL HIGH (ref 3.6–11.0)

## 2018-03-12 MED ORDER — DICYCLOMINE HCL 10 MG PO CAPS
10.0000 mg | ORAL_CAPSULE | Freq: Once | ORAL | Status: AC
  Administered 2018-03-12: 10 mg via ORAL
  Filled 2018-03-12: qty 1

## 2018-03-12 MED ORDER — IOPAMIDOL (ISOVUE-300) INJECTION 61%
30.0000 mL | Freq: Once | INTRAVENOUS | Status: AC
  Administered 2018-03-12: 30 mL via ORAL

## 2018-03-12 MED ORDER — SODIUM CHLORIDE 0.9 % IV BOLUS (SEPSIS)
1000.0000 mL | Freq: Once | INTRAVENOUS | Status: AC
  Administered 2018-03-13: 1000 mL via INTRAVENOUS

## 2018-03-12 NOTE — ED Triage Notes (Signed)
Patient states she ate chicken salad last night for dinner and woke up around 3am with diarrhea.  She states she has not self treated.  She has had 4 episodes of diarrhea.  Pt denies any abdominal pain or nausea.  Pt un NAD at this time.

## 2018-03-12 NOTE — ED Notes (Signed)
First RN note:  Patient sent by MD for lower abdominal pain.  Pain has been ongoing for 16 hours with progression in severity and patient presented at MD's office as pale and diaphoretic.

## 2018-03-12 NOTE — ED Provider Notes (Signed)
[facility] Emergency Department Provider Note ____________________________________________   First MD Initiated Contact with Patient 03/12/18 2316     (approximate)  I have reviewed the triage vital signs and the nursing notes.   HISTORY  Chief Complaint Diarrhea    HPI Lynn Sandoval is a 67 y.o. female with past medical history as noted below who presents with lower abdominal pain for approximately last 20 hours, described mainly as crampy but constant, nonradiating, associated with some nausea but no vomiting, and associated with nonbloody diarrhea.  Patient states that the diarrhea resolved several hours ago.  No prior history of this pain.  No sick contacts or other recent illness, or any unusual foods or travel recently.  Past Medical History:  Diagnosis Date  . Arthritis    fingers  . Cancer ([place])   . Colon polyps   . Family history of adverse reaction to anesthesia    son - slow to wake  . H/O dysplastic nevus 02/13/2017   Followed by Dr. Brendolyn Patty  . Heart murmur    GRADE 1 DIASTOLIC MURMUR PER ECHO IN MARCH 2018  . Hyperlipidemia   . Hypothyroidism   . Motion sickness    boats  . Osteopenia 02/25/2017   DEXA Feb 2018  . Thyroid disease     Patient Active Problem List   Diagnosis Date Noted  . Gallstone 09/24/2017  . Abdominal pain 09/17/2017  . Osteopenia 02/25/2017  . Encounter for hepatitis C screening test for low risk patient 02/13/2017  . Left atrial enlargement 02/13/2017  . H/O dysplastic nevus 02/13/2017  . Medication monitoring encounter 07/31/2016  . Family hx of melanoma 07/21/2016  . Personal history of colonic polyps   . Needs flu shot 01/21/2016  . Annual physical exam 01/21/2016  . Encounter for screening mammogram for malignant neoplasm of breast 01/21/2016  . Encounter for screening for cervical cancer  01/21/2016  . Hyperlipidemia LDL goal <100 07/29/2015  . Hypothyroidism, adult 07/29/2015  .  Adenomatous colon polyp 07/29/2015    Past Surgical History:  Procedure Laterality Date  . [place]  . CHOLECYSTECTOMY N/A 10/13/2017   Procedure: LAPAROSCOPIC CHOLECYSTECTOMY;  Surgeon: Jules Husbands, MD;  Location: [facility] ORS;  Service: General;  Laterality: N/A;  . COLONOSCOPY WITH PROPOFOL N/A 02/04/2016   Procedure: COLONOSCOPY WITH PROPOFOL;  Surgeon: Lucilla Lame, MD;  Location: [place];  Service: Endoscopy;  Laterality: N/A;  . EYE SURGERY  1955  . POLYPECTOMY  02/04/2016   Procedure: POLYPECTOMY;  Surgeon: Lucilla Lame, MD;  Location: [place];  Service: Endoscopy;;  . SKIN SURGERY Right    melanoma removal  . TONSILLECTOMY  1960  . TONSILLECTOMY      Prior to Admission medications   Medication Sig Start Date End Date Taking? Authorizing Provider  acetaminophen (TYLENOL) 325 MG tablet Take 650 mg by mouth every 6 (six) hours as needed for mild pain or headache.    [provider]  ASPIRIN 81 PO Take 1 tablet by mouth daily.    [provider]  atorvastatin (LIPITOR) 20 MG tablet TAKE 1 TABLET BY MOUTH AT  BEDTIME 01/01/18   Lada, Satira Anis, MD  Calcium Carbonate-Vitamin D (CALCIUM 500 + D PO) Take 1 tablet by mouth daily.    [provider]  levothyroxine (SYNTHROID, LEVOTHROID) 75 MCG tablet TAKE 1 TABLET BY MOUTH  DAILY BEFORE BREAKFAST 03/09/18   Lada, Satira Anis, MD  Melatonin 5  MG TABS Take 1 tablet by mouth at bedtime.    [provider]  Omega-3 Fatty Acids (FISH OIL) 1000 MG CAPS Take 1,000 mg by mouth daily.    [provider]  Propylene Glycol (SYSTANE BALANCE OP) Apply 1 drop to eye daily.    [provider]    Allergies Patient has no known allergies.  Family History  Problem Relation Age of Onset  . Depression Mother   . Hearing loss Mother   . Hyperlipidemia Mother   . Hypertension Mother   . Glaucoma Mother   . Cancer Mother        melanoma  . Heart disease  Father   . Heart attack Father   . Diabetes Maternal Grandmother   . Heart attack Maternal Grandmother   . Cancer Paternal Grandmother        colon  . Breast cancer Neg Hx     Social History Social History   Tobacco Use  . Smoking status: Never Smoker  . Smokeless tobacco: Never Used  Substance Use Topics  . Alcohol use: Yes    Alcohol/week: 1.8 oz    Types: 3 Glasses of wine per week    Comment: ocassional glass of wine  . Drug use: No    Review of Systems  Constitutional: Positive for chills. Eyes: No redness. ENT: No sore throat. Cardiovascular: Denies chest pain. Respiratory: Denies shortness of breath. Gastrointestinal: Positive for resolved diarrhea.  Genitourinary: Negative for dysuria.  Musculoskeletal: Negative for back pain. Skin: Negative for rash. Neurological: Negative for headache.   ____________________________________________   PHYSICAL EXAM:  VITAL SIGNS: ED Triage Vitals [03/12/18 1916]  Enc Vitals Group     BP (!) 133/54     Pulse Rate (!) 106     Resp 18     Temp 99.9 F (37.7 C)     Temp Source Oral     SpO2 97 %     Weight      Height      Head Circumference      Peak Flow      Pain Score 6     Pain Loc      Pain Edu?      Excl. in [place]?     Constitutional: Alert and oriented.  Relatively well appearing and in no acute distress. Eyes: Conjunctivae are normal.  No scleral icterus Head: Atraumatic. Nose: No congestion/rhinnorhea. Mouth/Throat: Mucous membranes are slightly dry.   Neck: Normal range of motion.  Cardiovascular:  Good peripheral circulation. Respiratory: Normal respiratory effort.   Gastrointestinal: Soft with mild bilateral lower quadrant tenderness. Genitourinary: No CVA tenderness. Musculoskeletal:  Extremities warm and well perfused.  Neurologic:  Normal speech and language. No gross focal neurologic deficits are appreciated.  Skin:  Skin is warm and dry. No rash noted. Psychiatric: Mood and affect are  normal. Speech and behavior are normal.  ____________________________________________   LABS (all labs ordered are listed, but only abnormal results are displayed)  Labs Reviewed  COMPREHENSIVE METABOLIC PANEL - Abnormal; Notable for the following components:      Result Value   Sodium 134 (*)    CO2 21 (*)    Glucose, Bld 106 (*)    Calcium 8.8 (*)    Alkaline Phosphatase 136 (*)    Total Bilirubin 1.3 (*)    All other components within normal limits  CBC - Abnormal; Notable for the following components:   WBC 12.8 (*)    All other components  within normal limits  URINALYSIS, COMPLETE (UACMP) WITH MICROSCOPIC - Abnormal; Notable for the following components:   Color, Urine YELLOW (*)    APPearance CLEAR (*)    Ketones, ur 80 (*)    Bacteria, UA RARE (*)    Squamous Epithelial / LPF 0-5 (*)    All other components within normal limits  LIPASE, BLOOD   ____________________________________________  EKG   ____________________________________________  RADIOLOGY  CT abdomen: Findings consistent with possible early acute appendicitis ____________________________________________   PROCEDURES  Procedure(s) performed: No  Procedures  Critical Care performed: No ____________________________________________   INITIAL IMPRESSION / ASSESSMENT AND PLAN / ED COURSE  Pertinent labs & imaging results that were available during my care of the patient were reviewed by me and considered in my medical decision making (see chart for details).  67 year old female with past medical history as noted above presents with lower abdominal pain for approximately the last 20 hours associated with nausea and with diarrhea which has since resolved.  She describes the pain is crampy but it has been constant.  Past medical records reviewed in Epic and are noncontributory.  On exam, the patient is well-appearing, vital signs are normal, and the abdomen is soft.  There is tenderness to  bilateral lower quadrants.  Overall presentation is most consistent with gastroenteritis versus foodborne illness, but given patient's age, the duration of the symptoms, and the tenderness on exam, presentation also concerning for colitis, diverticulitis, or other intra-abdominal cause.  Will obtain CT abdomen, symptomatic treatment with Bentyl, fluids, and reassess.  ----------------------------------------- 3:29 AM on 03/13/2018 -----------------------------------------  CT is concerning for acute appendicitis.  I consulted Dr. Bary Castilla from general surgery who has evaluated the patient in the ED and agrees to admit the patient to take her to the operating room.  The patient remains clinically stable.  Antibiotics given.     ____________________________________________   FINAL CLINICAL IMPRESSION(S) / ED DIAGNOSES  Final diagnoses:  Acute appendicitis, unspecified acute appendicitis type      NEW MEDICATIONS STARTED DURING THIS VISIT:  New Prescriptions   No medications on file     Note:  This document was prepared using Dragon voice recognition software and may include unintentional dictation errors.   Arta Silence, MD 03/13/18 0330

## 2018-03-13 ENCOUNTER — Emergency Department: Payer: Medicare Other

## 2018-03-13 ENCOUNTER — Encounter
Admission: EM | Disposition: A | Discharge status: Home / Self Care | Payer: Self-pay | Source: Home / Self Care | Attending: Emergency Medicine

## 2018-03-13 ENCOUNTER — Encounter: Payer: Self-pay | Admitting: Radiology

## 2018-03-13 ENCOUNTER — Other Ambulatory Visit: Payer: Self-pay

## 2018-03-13 ENCOUNTER — Emergency Department: Payer: Medicare Other | Admitting: Anesthesiology

## 2018-03-13 DIAGNOSIS — R109 Unspecified abdominal pain: Secondary | ICD-10-CM | POA: Diagnosis not present

## 2018-03-13 DIAGNOSIS — K3580 Unspecified acute appendicitis: Secondary | ICD-10-CM | POA: Diagnosis not present

## 2018-03-13 HISTORY — DX: Unspecified acute appendicitis: K35.80

## 2018-03-13 HISTORY — PX: LAPAROSCOPIC APPENDECTOMY: SHX408

## 2018-03-13 LAB — URINALYSIS, COMPLETE (UACMP) WITH MICROSCOPIC
Bilirubin Urine: NEGATIVE
Glucose, UA: NEGATIVE mg/dL
Hgb urine dipstick: NEGATIVE
Ketones, ur: 80 mg/dL — AB
Leukocytes, UA: NEGATIVE
Nitrite: NEGATIVE
Protein, ur: NEGATIVE mg/dL
Specific Gravity, Urine: 1.01 (ref 1.005–1.030)
pH: 6 (ref 5.0–8.0)

## 2018-03-13 SURGERY — APPENDECTOMY, LAPAROSCOPIC
Anesthesia: General | Site: Abdomen | Wound class: Clean Contaminated

## 2018-03-13 MED ORDER — ROCURONIUM BROMIDE 50 MG/5ML IV SOLN
INTRAVENOUS | Status: AC
Start: 2018-03-13 — End: ?
  Filled 2018-03-13: qty 1

## 2018-03-13 MED ORDER — MORPHINE SULFATE (PF) 2 MG/ML IV SOLN: 2.0000 mg | INTRAVENOUS | Status: DC | PRN

## 2018-03-13 MED ORDER — SUCCINYLCHOLINE CHLORIDE 20 MG/ML IJ SOLN
INTRAMUSCULAR | Status: AC
  Filled 2018-03-13: qty 1

## 2018-03-13 MED ORDER — KETOROLAC TROMETHAMINE 30 MG/ML IJ SOLN
INTRAMUSCULAR | Status: DC | PRN
  Administered 2018-03-13: 30 mg via INTRAVENOUS

## 2018-03-13 MED ORDER — HYDROCODONE-ACETAMINOPHEN 5-325 MG PO TABS: 1.0000 | ORAL_TABLET | ORAL | Status: DC | PRN

## 2018-03-13 MED ORDER — FENTANYL CITRATE (PF) 100 MCG/2ML IJ SOLN
INTRAMUSCULAR | Status: DC | PRN
  Administered 2018-03-13: 100 ug via INTRAVENOUS

## 2018-03-13 MED ORDER — LACTATED RINGERS IV SOLN: INTRAVENOUS | Status: DC

## 2018-03-13 MED ORDER — POLYVINYL ALCOHOL 1.4 % OP SOLN
1.0000 [drp] | Freq: Every day | OPHTHALMIC | Status: DC
  Filled 2018-03-13: qty 15

## 2018-03-13 MED ORDER — BUPIVACAINE HCL (PF) 0.5 % IJ SOLN
INTRAMUSCULAR | Status: DC | PRN
  Administered 2018-03-13: 20 mL

## 2018-03-13 MED ORDER — SUGAMMADEX SODIUM 200 MG/2ML IV SOLN
INTRAVENOUS | Status: DC | PRN
  Administered 2018-03-13: 200 mg via INTRAVENOUS

## 2018-03-13 MED ORDER — ROCURONIUM BROMIDE 100 MG/10ML IV SOLN
INTRAVENOUS | Status: DC | PRN
  Administered 2018-03-13: 30 mg via INTRAVENOUS

## 2018-03-13 MED ORDER — PROPYLENE GLYCOL 0.6 % OP SOLN: 1.0000 [drp] | Freq: Every day | OPHTHALMIC | Status: DC

## 2018-03-13 MED ORDER — IOPAMIDOL (ISOVUE-300) INJECTION 61%
100.0000 mL | Freq: Once | INTRAVENOUS | Status: AC | PRN
  Administered 2018-03-13: 100 mL via INTRAVENOUS

## 2018-03-13 MED ORDER — SODIUM CHLORIDE 0.9 % IV SOLN
1.0000 g | Freq: Once | INTRAVENOUS | Status: AC
  Administered 2018-03-13: 1 g via INTRAVENOUS
  Filled 2018-03-13: qty 1

## 2018-03-13 MED ORDER — HYDROCODONE-ACETAMINOPHEN 5-325 MG PO TABS: 1.0000 | ORAL_TABLET | ORAL | 0 refills | Status: DC | PRN

## 2018-03-13 MED ORDER — LEVOTHYROXINE SODIUM 50 MCG PO TABS
75.0000 ug | ORAL_TABLET | Freq: Every day | ORAL | Status: DC
  Administered 2018-03-13: 75 ug via ORAL
  Filled 2018-03-13: qty 2

## 2018-03-13 MED ORDER — PROPOFOL 10 MG/ML IV BOLUS
INTRAVENOUS | Status: DC | PRN
  Administered 2018-03-13: 150 mg via INTRAVENOUS

## 2018-03-13 MED ORDER — KETOROLAC TROMETHAMINE 30 MG/ML IJ SOLN
INTRAMUSCULAR | Status: AC
  Filled 2018-03-13: qty 1

## 2018-03-13 MED ORDER — ONDANSETRON HCL 4 MG/2ML IJ SOLN: 4.0000 mg | Freq: Once | INTRAMUSCULAR | Status: DC | PRN

## 2018-03-13 MED ORDER — PROMETHAZINE HCL 25 MG/ML IJ SOLN: 12.5000 mg | INTRAMUSCULAR | Status: DC | PRN

## 2018-03-13 MED ORDER — DEXAMETHASONE SODIUM PHOSPHATE 10 MG/ML IJ SOLN
INTRAMUSCULAR | Status: DC | PRN
  Administered 2018-03-13: 5 mg via INTRAVENOUS

## 2018-03-13 MED ORDER — ACETAMINOPHEN 10 MG/ML IV SOLN
INTRAVENOUS | Status: AC
  Filled 2018-03-13: qty 100

## 2018-03-13 MED ORDER — MIDAZOLAM HCL 2 MG/2ML IJ SOLN
INTRAMUSCULAR | Status: AC
  Filled 2018-03-13: qty 2

## 2018-03-13 MED ORDER — SUGAMMADEX SODIUM 200 MG/2ML IV SOLN
INTRAVENOUS | Status: AC
  Filled 2018-03-13: qty 2

## 2018-03-13 MED ORDER — SUCCINYLCHOLINE CHLORIDE 20 MG/ML IJ SOLN
INTRAMUSCULAR | Status: DC | PRN
  Administered 2018-03-13: 100 mg via INTRAVENOUS

## 2018-03-13 MED ORDER — LIDOCAINE HCL (CARDIAC) 20 MG/ML IV SOLN
INTRAVENOUS | Status: DC | PRN
  Administered 2018-03-13: 60 mg via INTRAVENOUS

## 2018-03-13 MED ORDER — MIDAZOLAM HCL 2 MG/2ML IJ SOLN
INTRAMUSCULAR | Status: DC | PRN
  Administered 2018-03-13: 2 mg via INTRAVENOUS

## 2018-03-13 MED ORDER — BUPIVACAINE HCL (PF) 0.5 % IJ SOLN
INTRAMUSCULAR | Status: AC
  Filled 2018-03-13: qty 30

## 2018-03-13 MED ORDER — FENTANYL CITRATE (PF) 100 MCG/2ML IJ SOLN: 25.0000 ug | INTRAMUSCULAR | Status: DC | PRN

## 2018-03-13 MED ORDER — ACETAMINOPHEN 325 MG PO TABS: 650.0000 mg | ORAL_TABLET | Freq: Four times a day (QID) | ORAL | Status: DC | PRN

## 2018-03-13 MED ORDER — FENTANYL CITRATE (PF) 100 MCG/2ML IJ SOLN
INTRAMUSCULAR | Status: AC
  Filled 2018-03-13: qty 2

## 2018-03-13 MED ORDER — ACETAMINOPHEN 10 MG/ML IV SOLN
INTRAVENOUS | Status: DC | PRN
  Administered 2018-03-13: 1000 mg via INTRAVENOUS

## 2018-03-13 MED ORDER — LACTATED RINGERS IV SOLN
INTRAVENOUS | Status: DC | PRN
  Administered 2018-03-13: 04:00:00 via INTRAVENOUS

## 2018-03-13 MED ORDER — PROPOFOL 10 MG/ML IV BOLUS
INTRAVENOUS | Status: AC
  Filled 2018-03-13: qty 20

## 2018-03-13 MED ORDER — LIDOCAINE HCL (PF) 2 % IJ SOLN
INTRAMUSCULAR | Status: AC
  Filled 2018-03-13: qty 10

## 2018-03-13 MED ORDER — ONDANSETRON HCL 4 MG/2ML IJ SOLN
INTRAMUSCULAR | Status: DC | PRN
  Administered 2018-03-13: 4 mg via INTRAVENOUS

## 2018-03-13 SURGICAL SUPPLY — 38 items
BENZOIN TINCTURE PRP APPL 2/3 (GAUZE/BANDAGES/DRESSINGS) ×2 IMPLANT
BLADE SURG 11 STRL SS SAFETY (MISCELLANEOUS) ×2 IMPLANT
CANISTER SUCT 1200ML W/VALVE (MISCELLANEOUS) ×2 IMPLANT
CANNULA DILATOR 10 W/SLV (CANNULA) ×4 IMPLANT
CHLORAPREP W/TINT 26ML (MISCELLANEOUS) ×2 IMPLANT
CUTTER FLEX LINEAR 45M (STAPLE) ×2 IMPLANT
DRSG TEGADERM 2-3/8X2-3/4 SM (GAUZE/BANDAGES/DRESSINGS) ×6 IMPLANT
DRSG TEGADERM 2X2.25 PEDS (GAUZE/BANDAGES/DRESSINGS) ×2 IMPLANT
DRSG TELFA 4X3 1S NADH ST (GAUZE/BANDAGES/DRESSINGS) ×2 IMPLANT
ELECT REM PT RETURN 9FT ADLT (ELECTROSURGICAL) ×2
ELECTRODE REM PT RTRN 9FT ADLT (ELECTROSURGICAL) ×1 IMPLANT
GLOVE BIO SURGEON STRL SZ7.5 (GLOVE) ×4 IMPLANT
GLOVE INDICATOR 8.0 STRL GRN (GLOVE) ×4 IMPLANT
GOWN STRL REUS W/ TWL LRG LVL3 (GOWN DISPOSABLE) ×2 IMPLANT
GOWN STRL REUS W/TWL LRG LVL3 (GOWN DISPOSABLE) ×2
GRASPER SUT TROCAR 14GX15 (MISCELLANEOUS) ×2 IMPLANT
IRRIGATION STRYKERFLOW (MISCELLANEOUS) IMPLANT
IRRIGATOR STRYKERFLOW (MISCELLANEOUS)
IV LACTATED RINGERS 1000ML (IV SOLUTION) IMPLANT
KIT TURNOVER KIT A (KITS) ×2 IMPLANT
LABEL OR SOLS (LABEL) ×2 IMPLANT
NDL INSUFF ACCESS 14 VERSASTEP (NEEDLE) ×2 IMPLANT
NEEDLE HYPO 22GX1.5 SAFETY (NEEDLE) ×2 IMPLANT
NS IRRIG 1000ML POUR BTL (IV SOLUTION) IMPLANT
PACK LAP CHOLECYSTECTOMY (MISCELLANEOUS) ×2 IMPLANT
POUCH ENDO CATCH 10MM SPEC (MISCELLANEOUS) ×2 IMPLANT
RELOAD 45 VASCULAR/THIN (ENDOMECHANICALS) ×2 IMPLANT
RELOAD STAPLE TA45 3.5 REG BLU (ENDOMECHANICALS) ×4 IMPLANT
SEAL FOR SCOPE WARMER C3101 (MISCELLANEOUS) IMPLANT
STRIP CLOSURE SKIN 1/2X4 (GAUZE/BANDAGES/DRESSINGS) ×2 IMPLANT
SUT MAXON 0 T 12 3 (SUTURE) ×2 IMPLANT
SUT VIC AB 0 CT2 27 (SUTURE) ×2 IMPLANT
SUT VIC AB 4-0 FS2 27 (SUTURE) ×2 IMPLANT
SWABSTK COMLB BENZOIN TINCTURE (MISCELLANEOUS) ×2 IMPLANT
SYR 10ML LL (SYRINGE) ×2 IMPLANT
TRAY FOLEY W/METER SILVER 16FR (SET/KITS/TRAYS/PACK) IMPLANT
TROCAR XCEL 12X100 BLDLESS (ENDOMECHANICALS) ×2 IMPLANT
TUBING INSUFFLATION (TUBING) ×2 IMPLANT

## 2018-03-13 NOTE — ED Notes (Signed)
Surgeon at bedside.  

## 2018-03-13 NOTE — ED Notes (Signed)
CT notified of IV access 

## 2018-03-13 NOTE — Care Management Obs Status (Signed)
[facility] NOTIFICATION   Patient Details  Name: Orissa Arreaga MRN: [id number] Date of Birth: 24-Sep-1951   Medicare Observation Status Notification Given:  Yes    Katrina Stack, RN 03/13/2018, 9:09 AM

## 2018-03-13 NOTE — Op Note (Signed)
Preoperative diagnosis: Acute appendicitis.  Postoperative diagnosis: Same.  Operative procedure: Laparoscopic appendectomy.  Operating Surgeon: Hervey Ard, MD.  Anesthesia: General endotracheal.  Marcaine 0.5%, plain, 20 cc.  Estimated blood loss: Less than 5 cc.  Clinical note: This 67 year old woman presented with a less than 24-hour history of lower abdominal cramping and right lower quadrant pain.  CT scan said evidence of appendicitis.  She was admitted for appendectomy.  She received Invanz prior to the procedure.  She had SCD stockings for DVT prevention.  Operative note: After the induction of general endotracheal anesthesia the abdomen was cleansed with ChloraPrep and draped.  In Trendelenburg position a varies needle was placed through a trans-umbilical incision.  After assuring intra-abdominal location with a hanging drop test the abdomen was insufflated with CO2 at 10 mmHg pressure.  A 10 mm Step port was expanded and inspection showed no evidence of injury from initial port placement.  There was evidence of purulence in the right lower quadrant overlying the swollen appendix.  A 10 mm Step port was placed in the hypogastrium under direct vision and a 12 mm XL port placed in the left lower quadrant outside the rectus sheath.  The base of the appendix was cleared and the appendix divided from the cecum making use of a blue GIA cartridge.  The mesoappendix divided with a white GIA cartridge.  Single arterial bleeder was controlled with electrocautery.  The appendix was placed into an Endo Catch bag and then delivered to the 12 mm port site.  The right lower quadrant was irrigated with saline.  Good hemostasis was noted.  No other intra-abdominal pathology appreciated.  No injury from initial port placement.  The 12 mm port site was closed making use of a "cone" and a PMI suture passer with 0 Maxon.  Skin incisions were closed with 4-0 Vicryl subcuticular sutures.  Benzoin,  Steri-Strips, Telfa and Tegaderm dressing applied.  Patient tolerated the procedure well and was taken recovery room in stable condition.

## 2018-03-13 NOTE — Discharge Instructions (Addendum)
In addition to included general post-operative instructions for Laparoscopic Appendectomy,  Diet: Resume home heart healthy diet.   Activity: No heavy lifting >20 pounds (children, pets, laundry, garbage) or strenuous activity until follow-up, but light activity and walking are encouraged. Do not drive or drink alcohol if taking narcotic pain medications.  Wound care: 2 days after surgery (Monday, 3/18), may shower/get incision wet with soapy water and pat dry (do not rub incisions), but no baths or submerging incision underwater until follow-up.   Medications: Resume all home medications. For mild to moderate pain: acetaminophen (Tylenol) or ibuprofen/naproxen (if no kidney disease). Combining Tylenol with alcohol can substantially increase your risk of causing liver disease. Narcotic pain medications, if prescribed, can be used for severe pain, though may cause nausea, constipation, and drowsiness. Do not combine Tylenol and Percocet (or similar) within a 6 hour period as Percocet (and similar) contain(s) Tylenol. If you do not need the narcotic pain medication, you do not need to fill the prescription.  Call office [phone]) at any time if any questions, worsening pain, fevers/chills, bleeding, drainage from incision site, or other concerns.

## 2018-03-13 NOTE — Anesthesia Preprocedure Evaluation (Signed)
Anesthesia Evaluation  Patient identified by MRN, date of birth, ID band Patient awake    Reviewed: Allergy & Precautions, NPO status , Patient's Chart, lab work & pertinent test results, reviewed documented beta blocker date and time   History of Anesthesia Complications (+) Family history of anesthesia reaction  Airway Mallampati: III  TM Distance: >3 FB     Dental  (+) Chipped   Pulmonary           Cardiovascular      Neuro/Psych    GI/Hepatic   Endo/Other  Hypothyroidism   Renal/GU      Musculoskeletal  (+) Arthritis ,   Abdominal   Peds  Hematology   Anesthesia Other Findings   Reproductive/Obstetrics                             Anesthesia Physical Anesthesia Plan  ASA: II  Anesthesia Plan: General   Post-op Pain Management:    Induction: Intravenous  PONV Risk Score and Plan:   Airway Management Planned: Oral ETT  Additional Equipment:   Intra-op Plan:   Post-operative Plan:   Informed Consent: I have reviewed the patients History and Physical, chart, labs and discussed the procedure including the risks, benefits and alternatives for the proposed anesthesia with the patient or authorized representative who has indicated his/her understanding and acceptance.     Plan Discussed with: CRNA  Anesthesia Plan Comments:         Anesthesia Quick Evaluation

## 2018-03-13 NOTE — Discharge Summary (Signed)
Physician Discharge Summary  Patient ID: Lynn Sandoval MRN: [id number] DOB/AGE: 66/12/1950 66 y.o.  Admit date: 03/12/2018 Discharge date: 03/13/2018  Admission Diagnoses:  Discharge Diagnoses:  Active Problems:   Appendicitis, acute   Discharged Condition: good  Hospital Course: 67 y.o. female presented to [facility] ED for abdominal pain. Workup was found to be significant for CT imaging demonstrating acute appendicitis. Informed consent was obtained and documented, and patient underwent uneventful laparoscopic appendectomy ([place], 03/13/2018).  Post-operatively, patient's pain improved/resolved and advancement of patient's diet and ambulation were well-tolerated. The remainder of patient's hospital course was essentially unremarkable, and discharge planning was initiated accordingly with patient safely able to be discharged home with appropriate discharge instructions, antibiotics, pain control, and outpatient surgical follow-up after all of her and family's questions were answered to their expressed satisfaction.  Consults: None  Significant Diagnostic Studies: radiology: CT scan: acute appendicitis  Treatments: IV hydration, antibiotics: ertapenem and surgery: laparoscopic appendectomy ([facility]rnett, 03/13/2018)  Discharge Exam: Blood pressure 129/61, pulse 83, temperature 98.4 F (36.9 C), temperature source Oral, resp. rate 18, height 5\' 4"  (1.626 m), weight 198 lb 3.2 oz (89.9 kg), SpO2 96 %. General appearance: alert, cooperative and no distress GI: abdomen soft and non-distended with mild Left-sided peri-incisional tenderness to palpation, incisional dressings c/d/i  Disposition:    Allergies as of 03/13/2018   No Known Allergies     Medication List    TAKE these medications   acetaminophen 325 MG tablet Commonly known as:  TYLENOL Take 650 mg by mouth every 6 (six) hours as needed for mild pain or headache.   ASPIRIN 81 PO Take 1 tablet by mouth daily.   atorvastatin 20  MG tablet Commonly known as:  LIPITOR TAKE 1 TABLET BY MOUTH AT  BEDTIME   CALCIUM 500 + D PO Take 1 tablet by mouth daily.   Fish Oil 1000 MG Caps Take 1,000 mg by mouth daily.   HYDROcodone-acetaminophen 5-325 MG tablet Commonly known as:  NORCO/VICODIN Take 1 tablet by mouth every 4 (four) hours as needed for moderate pain.   levothyroxine 75 MCG tablet Commonly known as:  SYNTHROID, LEVOTHROID TAKE 1 TABLET BY MOUTH  DAILY BEFORE BREAKFAST   Melatonin 5 MG Tabs Take 1 tablet by mouth at bedtime.   SYSTANE BALANCE OP Apply 1 drop to eye daily.      Follow-up [place]. Schedule an appointment as soon as possible for a visit in 2 week(s).   Specialty:  General Surgery Contact information: [address] [place]          Signed: Vickie Epley 03/13/2018, 4:14 PM

## 2018-03-13 NOTE — Progress Notes (Signed)
Discharge order received. Patient is alert and oriented. Vital signs stable . No signs of acute distress. Discharge instructions given. Patient verbalized understanding. No other issues noted at this time.   

## 2018-03-13 NOTE — Transfer of Care (Signed)
Immediate Anesthesia Transfer of Care Note  Patient: Mozelle Remlinger  Procedure(s) Performed: APPENDECTOMY LAPAROSCOPIC (N/A Abdomen)  Patient Location: PACU  Anesthesia Type:General  Level of Consciousness: sedated  Airway & Oxygen Therapy: Patient Spontanous Breathing and Patient connected to face mask oxygen  Post-op Assessment: Report given to RN and Post -op Vital signs reviewed and stable  Post vital signs: Reviewed and stable  Last Vitals:  Vitals:   03/13/18 0315 03/13/18 0506  BP:  119/70  Pulse: 99 98  Resp:  17  Temp:  37.6 C  SpO2: 96% 98%    Last Pain:  Vitals:   03/13/18 0327  TempSrc:   PainSc: 7          Complications: No apparent anesthesia complications

## 2018-03-13 NOTE — ED Notes (Signed)
CT called, contrast complete

## 2018-03-13 NOTE — ED Notes (Signed)
Consent obtained

## 2018-03-13 NOTE — Anesthesia Post-op Follow-up Note (Signed)
Anesthesia QCDR form completed.        

## 2018-03-13 NOTE — Progress Notes (Signed)
Chaplain visited the patient to discuss AD. Patient had already been discharged. Chaplain closed the order request for AD.

## 2018-03-13 NOTE — H&P (Signed)
Lynn Sandoval is an 67 y.o. female.   Chief Complaint: Abdominal pain. HPI: Healthy 67 year old woman was well until being awakened from sleep approximately 3 AM on the morning of March 12, 2018.  She reported crampy lower abdominal pain but no significant nausea or vomiting.  She had several loose stools without relief of her abdominal discomfort.  She limited herself to liquids that day but her symptoms persisted.  She was seen at a walk-in clinic and then referred to the emergency department.  Initial impression there was possible gastroenteritis with low-grade fever and leukocytosis, but subsequent CT scan showed evidence of swelling of the proximal appendix with periappendiceal inflammatory changes.  She is seen now for assessment.  The patient's general health is good.  She exercises regularly.  She has been diagnosed with a heart murmur on clinical exam and confirmed on echo with no clinical symptoms.  Born in [place] where her father was stationed, grew up in [place], moved to [place] from [place].  Therefore.  Past Medical History:  Diagnosis Date  . Arthritis    fingers  . Cancer ([place])   . Colon polyps   . Family history of adverse reaction to anesthesia    son - slow to wake  . H/O dysplastic nevus 02/13/2017   Followed by Dr. Brendolyn Patty  . Heart murmur    GRADE 1 DIASTOLIC MURMUR PER ECHO IN MARCH 2018  . Hyperlipidemia   . Hypothyroidism   . Motion sickness    boats  . Osteopenia 02/25/2017   DEXA Feb 2018  . Thyroid disease     Past Surgical History:  Procedure Laterality Date  . [place]  . CHOLECYSTECTOMY N/A 10/13/2017   Procedure: LAPAROSCOPIC CHOLECYSTECTOMY;  Surgeon: Jules Husbands, MD;  Location: [facility];  Service: General;  Laterality: N/A;  . COLONOSCOPY WITH PROPOFOL N/A 02/04/2016   Procedure: COLONOSCOPY WITH PROPOFOL;  Surgeon: Lucilla Lame, MD;  Location: [facility];  Service: Endoscopy;   Laterality: N/A;  . EYE SURGERY  1955  . POLYPECTOMY  02/04/2016   Procedure: POLYPECTOMY;  Surgeon: Lucilla Lame, MD;  Location: [facility];  Service: Endoscopy;;  . SKIN SURGERY Right    melanoma removal  . TONSILLECTOMY  1960  . TONSILLECTOMY      Family History  Problem Relation Age of Onset  . Depression Mother   . Hearing loss Mother   . Hyperlipidemia Mother   . Hypertension Mother   . Glaucoma Mother   . Cancer Mother        melanoma  . Heart disease Father   . Heart attack Father   . Diabetes Maternal Grandmother   . Heart attack Maternal Grandmother   . Cancer Paternal Grandmother        colon  . Breast cancer Neg Hx    Social History:  reports that  has never smoked. she has never used smokeless tobacco. She reports that she drinks about 1.8 oz of alcohol per week. She reports that she does not use drugs.  Allergies: No Known Allergies   (Not in a hospital admission)  Results for orders placed or performed during the hospital encounter of 03/12/18 (from the past 48 hour(s))  Lipase, blood     Status: None   Collection Time: 03/12/18  7:20 PM  Result Value Ref Range   Lipase 24 11 - 51 U/L    Comment: Performed at [facility], [address] [place], [place] [address]  Comprehensive metabolic panel     Status: Abnormal   Collection Time: 03/12/18  7:20 PM  Result Value Ref Range   Sodium 134 (L) 135 - 145 mmol/L   Potassium 3.7 3.5 - 5.1 mmol/L   Chloride 101 101 - 111 mmol/L   CO2 21 (L) 22 - 32 mmol/L   Glucose, Bld 106 (H) 65 - 99 mg/dL   BUN 15 6 - 20 mg/dL   Creatinine, Ser 0.71 0.44 - 1.00 mg/dL   Calcium 8.8 (L) 8.9 - 10.3 mg/dL   Total Protein 7.3 6.5 - 8.1 g/dL   Albumin 4.6 3.5 - 5.0 g/dL   AST 32 15 - 41 U/L   ALT 42 14 - 54 U/L   Alkaline Phosphatase 136 (H) 38 - 126 U/L   Total Bilirubin 1.3 (H) 0.3 - 1.2 mg/dL   GFR calc non Af Amer >60 >60 mL/min   GFR calc Af Amer >60 >60 mL/min    Comment: (NOTE) The eGFR has  been calculated using the CKD EPI equation. This calculation has not been validated in all clinical situations. eGFR's persistently <60 mL/min signify possible Chronic Kidney Disease.    Anion gap 12 5 - 15    Comment: Performed at [facility], [address] [place], [place] [address]  CBC     Status: Abnormal   Collection Time: 03/12/18  7:20 PM  Result Value Ref Range   WBC 12.8 (H) 3.6 - 11.0 K/uL   RBC 5.20 3.80 - 5.20 MIL/uL   Hemoglobin 14.7 12.0 - 16.0 g/dL   HCT 42.7 35.0 - 47.0 %   MCV 82.2 80.0 - 100.0 fL   MCH 28.3 26.0 - 34.0 pg   MCHC 34.5 32.0 - 36.0 g/dL   RDW 13.1 11.5 - 14.5 %   Platelets 272 150 - 440 K/uL    Comment: Performed at [facility], [address] [place], [place] [address]  Urinalysis, Complete w Microscopic     Status: Abnormal   Collection Time: 03/13/18 12:01 AM  Result Value Ref Range   Color, Urine YELLOW (A) YELLOW   APPearance CLEAR (A) CLEAR   Specific Gravity, Urine 1.010 1.005 - 1.030   pH 6.0 5.0 - 8.0   Glucose, UA NEGATIVE NEGATIVE mg/dL   Hgb urine dipstick NEGATIVE NEGATIVE   Bilirubin Urine NEGATIVE NEGATIVE   Ketones, ur 80 (A) NEGATIVE mg/dL   Protein, ur NEGATIVE NEGATIVE mg/dL   Nitrite NEGATIVE NEGATIVE   Leukocytes, UA NEGATIVE NEGATIVE   RBC / HPF 0-5 0 - 5 RBC/hpf   WBC, UA 0-5 0 - 5 WBC/hpf   Bacteria, UA RARE (A) NONE SEEN   Squamous Epithelial / LPF 0-5 (A) NONE SEEN   Mucus PRESENT     Comment: Performed at [facility], [address] [place], [place] [address]   Ct Abdomen Pelvis W Contrast  Result Date: 03/13/2018 CLINICAL DATA:  Abdominal pain EXAM: CT ABDOMEN AND PELVIS WITH CONTRAST TECHNIQUE: Multidetector CT imaging of the abdomen and pelvis was performed using the standard protocol following bolus administration of intravenous contrast. CONTRAST:  144m ISOVUE-300 IOPAMIDOL (ISOVUE-300) INJECTION 61% COMPARISON:  None. FINDINGS: Lower chest: No basilar pulmonary nodules or  pleural effusion. No apical pericardial effusion. Hepatobiliary: Normal hepatic contours and density. No visible biliary dilatation. Status post cholecystectomy. Pancreas: Normal parenchymal contours without ductal dilatation. No peripancreatic fluid collection. Spleen: Normal. Adrenals/Urinary Tract: --Adrenal glands: Normal. --Right kidney/ureter: No  hydronephrosis, perinephric stranding or nephrolithiasis. No obstructing ureteral stones. --Left kidney/ureter: No hydronephrosis, perinephric stranding or nephrolithiasis. No obstructing ureteral stones. --Urinary bladder: Normal appearance for the degree of distention. Stomach/Bowel: --Stomach/Duodenum: No hiatal hernia or other gastric abnormality. Normal duodenal course. --Small bowel: No dilatation or inflammation. --Colon: No focal abnormality. -- Appendix: Location: Subileal Diameter: Approximately 9-10 mm proximally. Distal appendix is normal caliber. Appendicolith: No Mucosal hyper-enhancement: Mild Extraluminal gas: No Periappendiceal collection: No Vascular/Lymphatic: Atherosclerotic calcification is present within the non-aneurysmal abdominal aorta, without hemodynamically significant stenosis. The portal vein, splenic vein, superior mesenteric vein and IVC are patent. No abdominal or pelvic lymphadenopathy. Reproductive: Normal uterus and ovaries. Musculoskeletal. No bony spinal canal stenosis or focal osseous abnormality. Other: None. IMPRESSION: 1. Dilated proximal appendix with mild surrounding inflammatory change suggesting early appendicitis. 2. No periappendiceal collection or free intraperitoneal air. Electronically Signed   By: Ulyses Jarred M.D.   On: 03/13/2018 00:54    ROS  Blood pressure 122/62, pulse 99, temperature 98.8 F (37.1 C), temperature source Oral, resp. rate 16, SpO2 96 %. Physical Exam  Constitutional: She appears well-developed and well-nourished.  HENT:  Head: Normocephalic and atraumatic.  Eyes: Pupils are equal,  round, and reactive to light.  Neck: Neck supple. No thyromegaly present.  Cardiovascular: Normal rate and regular rhythm.  Murmur heard.  Systolic murmur is present with a grade of 2/6. Respiratory: Effort normal and breath sounds normal.  GI: Bowel sounds are normal.       Assessment/Plan Clinical history and exam suggestive for early appendicitis.  Diarrhea somewhat atypical based on inflammatory changes identified on CT.  Options for management reviewed: 1) IV antibiotics with observation for 48 hours with 70% plus likelihood of resolution of symptoms versus 2) early operative intervention.  At this time, plans are for appendectomy.  Robert Bellow, MD 03/13/2018, 3:34 AM

## 2018-03-13 NOTE — Anesthesia Procedure Notes (Signed)
Procedure Name: Intubation Date/Time: 03/13/2018 4:21 AM Performed by: Hedda Slade, CRNA Pre-anesthesia Checklist: Patient identified, Patient being monitored, Timeout performed, Emergency Drugs available and Suction available Patient Re-evaluated:Patient Re-evaluated prior to induction Oxygen Delivery Method: Circle system utilized Preoxygenation: Pre-oxygenation with 100% oxygen Induction Type: IV induction, Rapid sequence and Cricoid Pressure applied Laryngoscope Size: 3 and McGraph Grade View: Grade I Tube type: Oral Tube size: 7.0 mm Number of attempts: 2 Airway Equipment and Method: Stylet Placement Confirmation: ETT inserted through vocal cords under direct vision,  positive ETCO2 and breath sounds checked- equal and bilateral Secured at: 21 cm Tube secured with: Tape Dental Injury: Teeth and Oropharynx as per pre-operative assessment  Difficulty Due To: Difficulty was anticipated and Difficult Airway- due to anterior larynx Future Recommendations: Recommend- induction with short-acting agent, and alternative techniques readily available Comments: Grade III view with MAC 3.

## 2018-03-13 NOTE — Anesthesia Postprocedure Evaluation (Signed)
Anesthesia Post Note  Patient: Lynn Sandoval  Procedure(s) Performed: APPENDECTOMY LAPAROSCOPIC (N/A Abdomen)  Patient location during evaluation: PACU Anesthesia Type: General Level of consciousness: awake and alert Pain management: pain level controlled Vital Signs Assessment: post-procedure vital signs reviewed and stable Respiratory status: spontaneous breathing, nonlabored ventilation, respiratory function stable and patient connected to nasal cannula oxygen Cardiovascular status: blood pressure returned to baseline and stable Postop Assessment: no apparent nausea or vomiting Anesthetic complications: no     Last Vitals:  Vitals:   03/13/18 0605 03/13/18 0645  BP: (!) 120/46 (!) 109/39  Pulse: 86 81  Resp: 18 18  Temp: 37.5 C 36.7 C  SpO2: 94% 95%    Last Pain:  Vitals:   03/13/18 0645  TempSrc: Oral  PainSc:                  [employer]

## 2018-03-14 ENCOUNTER — Encounter: Payer: Self-pay | Admitting: General Surgery

## 2018-03-16 LAB — SURGICAL PATHOLOGY

## 2018-03-19 ENCOUNTER — Encounter: Payer: Self-pay | Admitting: Family Medicine

## 2018-03-19 ENCOUNTER — Ambulatory Visit (INDEPENDENT_AMBULATORY_CARE_PROVIDER_SITE_OTHER): Payer: Medicare Other | Admitting: Family Medicine

## 2018-03-19 VITALS — BP 132/80 | HR 80 | Temp 97.6°F | Ht 63.5 in | Wt 152.8 lb

## 2018-03-19 DIAGNOSIS — D0361 Melanoma in situ of right upper limb, including shoulder: Secondary | ICD-10-CM

## 2018-03-19 DIAGNOSIS — Z23 Encounter for immunization: Secondary | ICD-10-CM

## 2018-03-19 DIAGNOSIS — L821 Other seborrheic keratosis: Secondary | ICD-10-CM | POA: Diagnosis not present

## 2018-03-19 DIAGNOSIS — Z0000 Encounter for general adult medical examination without abnormal findings: Secondary | ICD-10-CM

## 2018-03-19 MED ORDER — FLUTICASONE PROPIONATE 50 MCG/ACT NA SUSP: 2.0000 | Freq: Every day | NASAL | 6 refills | Status: DC

## 2018-03-19 NOTE — Patient Instructions (Addendum)
Pneumococcal Vaccine, Polyvalent solution for injection What is this medicine? PNEUMOCOCCAL VACCINE, POLYVALENT (NEU mo KOK al vak SEEN, pol ee VEY luhnt) is a vaccine to prevent pneumococcus bacteria infection. These bacteria are a major cause of ear infections, Strep throat infections, and serious pneumonia, meningitis, or blood infections worldwide. These vaccines help the body to produce antibodies (protective substances) that help your body defend against these bacteria. This vaccine is recommended for people 61 years of age and older with health problems. It is also recommended for all adults over 73 years old. This vaccine will not treat an infection. This medicine may be used for other purposes; ask your health care provider or pharmacist if you have questions. COMMON BRAND NAME(S): Pneumovax 23 What should I tell my health care provider before I take this medicine? They need to know if you have any of these conditions: -bleeding problems -bone marrow or organ transplant -cancer, Hodgkin's disease -fever -infection -immune system problems -low platelet count in the blood -seizures -an unusual or allergic reaction to pneumococcal vaccine, diphtheria toxoid, other vaccines, latex, other medicines, foods, dyes, or preservatives -pregnant or trying to get pregnant -breast-feeding How should I use this medicine? This vaccine is for injection into a muscle or under the skin. It is given by a health care professional. A copy of Vaccine Information Statements will be given before each vaccination. Read this sheet carefully each time. The sheet may change frequently. Talk to your pediatrician regarding the use of this medicine in children. While this drug may be prescribed for children as young as 63 years of age for selected conditions, precautions do apply. Overdosage: If you think you have taken too much of this medicine contact a poison control center or emergency room at once. NOTE: This  medicine is only for you. Do not share this medicine with others. What if I miss a dose? It is important not to miss your dose. Call your doctor or health care professional if you are unable to keep an appointment. What may interact with this medicine? -medicines for cancer chemotherapy -medicines that suppress your immune function -medicines that treat or prevent blood clots like warfarin, enoxaparin, and dalteparin -steroid medicines like prednisone or cortisone This list may not describe all possible interactions. Give your health care provider a list of all the medicines, herbs, non-prescription drugs, or dietary supplements you use. Also tell them if you smoke, drink alcohol, or use illegal drugs. Some items may interact with your medicine. What should I watch for while using this medicine? Mild fever and pain should go away in 3 days or less. Report any unusual symptoms to your doctor or health care professional. What side effects may I notice from receiving this medicine? Side effects that you should report to your doctor or health care professional as soon as possible: -allergic reactions like skin rash, itching or hives, swelling of the face, lips, or tongue -breathing problems -confused -fever over 102 degrees F -pain, tingling, numbness in the hands or feet -seizures -unusual bleeding or bruising -unusual muscle weakness Side effects that usually do not require medical attention (report to your doctor or health care professional if they continue or are bothersome): -aches and pains -diarrhea -fever of 102 degrees F or less -headache -irritable -loss of appetite -pain, tender at site where injected -trouble sleeping This list may not describe all possible side effects. Call your doctor for medical advice about side effects. You may report side effects to FDA at 1-800-FDA-1088. Where should  I keep my medicine? This does not apply. This vaccine is given in a clinic, pharmacy,  doctor's office, or other health care setting and will not be stored at home. NOTE: This sheet is a summary. It may not cover all possible information. If you have questions about this medicine, talk to your doctor, pharmacist, or health care provider.  2018 Elsevier/Gold Standard (2008-07-21 14:32:37)   Consider getting the new shingles vaccine called Shingrix; that is available for individuals 23 years of age and older, and is recommended even if you have had shingles in the past and/or already received the old shingles vaccine (Zostavax); it is a two-part series, and is available at many local pharmacies  You have received the Pneumovax vaccine (PPSV-23) and you will not need another booster of this for the rest of your life per current ACIP guidelines  Your next bone density test will be due on or after Feb 17, 2019  Health Maintenance, Female Adopting a healthy lifestyle and getting preventive care can go a long way to promote health and wellness. Talk with your health care provider about what schedule of regular examinations is right for you. This is a good chance for you to check in with your provider about disease prevention and staying healthy. In between checkups, there are plenty of things you can do on your own. Experts have done a lot of research about which lifestyle changes and preventive measures are most likely to keep you healthy. Ask your health care provider for more information. Weight and diet Eat a healthy diet  Be sure to include plenty of vegetables, fruits, low-fat dairy products, and lean protein.  Do not eat a lot of foods high in solid fats, added sugars, or salt.  Get regular exercise. This is one of the most important things you can do for your health. ? Most adults should exercise for at least 150 minutes each week. The exercise should increase your heart rate and make you sweat (moderate-intensity exercise). ? Most adults should also do strengthening exercises  at least twice a week. This is in addition to the moderate-intensity exercise.  Maintain a healthy weight  Body mass index (BMI) is a measurement that can be used to identify possible weight problems. It estimates body fat based on height and weight. Your health care provider can help determine your BMI and help you achieve or maintain a healthy weight.  For females 87 years of age and older: ? A BMI below 18.5 is considered underweight. ? A BMI of 18.5 to 24.9 is normal. ? A BMI of 25 to 29.9 is considered overweight. ? A BMI of 30 and above is considered obese.  Watch levels of cholesterol and blood lipids  You should start having your blood tested for lipids and cholesterol at 66 years of age, then have this test every 5 years.  You may need to have your cholesterol levels checked more often if: ? Your lipid or cholesterol levels are high. ? You are older than 66 years of age. ? You are at high risk for heart disease.  Cancer screening Lung Cancer  Lung cancer screening is recommended for adults 19-48 years old who are at high risk for lung cancer because of a history of smoking.  A yearly low-dose CT scan of the lungs is recommended for people who: ? Currently smoke. ? Have quit within the past 15 years. ? Have at least a 30-pack-year history of smoking. A pack year is smoking an  average of one pack of cigarettes a day for 1 year.  Yearly screening should continue until it has been 15 years since you quit.  Yearly screening should stop if you develop a health problem that would prevent you from having lung cancer treatment.  Breast Cancer  Practice breast self-awareness. This means understanding how your breasts normally appear and feel.  It also means doing regular breast self-exams. Let your health care provider know about any changes, no matter how small.  If you are in your 20s or 30s, you should have a clinical breast exam (CBE) by a health care provider every 1-3  years as part of a regular health exam.  If you are 32 or older, have a CBE every year. Also consider having a breast X-ray (mammogram) every year.  If you have a family history of breast cancer, talk to your health care provider about genetic screening.  If you are at high risk for breast cancer, talk to your health care provider about having an MRI and a mammogram every year.  Breast cancer gene (BRCA) assessment is recommended for women who have family members with BRCA-related cancers. BRCA-related cancers include: ? Breast. ? Ovarian. ? Tubal. ? Peritoneal cancers.  Results of the assessment will determine the need for genetic counseling and BRCA1 and BRCA2 testing.  Cervical Cancer Your health care provider may recommend that you be screened regularly for cancer of the pelvic organs (ovaries, uterus, and vagina). This screening involves a pelvic examination, including checking for microscopic changes to the surface of your cervix (Pap test). You may be encouraged to have this screening done every 3 years, beginning at age 29.  For women ages 88-65, health care providers may recommend pelvic exams and Pap testing every 3 years, or they may recommend the Pap and pelvic exam, combined with testing for human papilloma virus (HPV), every 5 years. Some types of HPV increase your risk of cervical cancer. Testing for HPV may also be done on women of any age with unclear Pap test results.  Other health care providers may not recommend any screening for nonpregnant women who are considered low risk for pelvic cancer and who do not have symptoms. Ask your health care provider if a screening pelvic exam is right for you.  If you have had past treatment for cervical cancer or a condition that could lead to cancer, you need Pap tests and screening for cancer for at least 20 years after your treatment. If Pap tests have been discontinued, your risk factors (such as having a new sexual partner) need to  be reassessed to determine if screening should resume. Some women have medical problems that increase the chance of getting cervical cancer. In these cases, your health care provider may recommend more frequent screening and Pap tests.  Colorectal Cancer  This type of cancer can be detected and often prevented.  Routine colorectal cancer screening usually begins at 66 years of age and continues through 66 years of age.  Your health care provider may recommend screening at an earlier age if you have risk factors for colon cancer.  Your health care provider may also recommend using home test kits to check for hidden blood in the stool.  A small camera at the end of a tube can be used to examine your colon directly (sigmoidoscopy or colonoscopy). This is done to check for the earliest forms of colorectal cancer.  Routine screening usually begins at age 30.  Direct examination of the  colon should be repeated every 5-10 years through 66 years of age. However, you may need to be screened more often if early forms of precancerous polyps or small growths are found.  Skin Cancer  Check your skin from head to toe regularly.  Tell your health care provider about any new moles or changes in moles, especially if there is a change in a mole's shape or color.  Also tell your health care provider if you have a mole that is larger than the size of a pencil eraser.  Always use sunscreen. Apply sunscreen liberally and repeatedly throughout the day.  Protect yourself by wearing long sleeves, pants, a wide-brimmed hat, and sunglasses whenever you are outside.  Heart disease, diabetes, and high blood pressure  High blood pressure causes heart disease and increases the risk of stroke. High blood pressure is more likely to develop in: ? People who have blood pressure in the high end of the normal range (130-139/85-89 mm Hg). ? People who are overweight or obese. ? People who are African American.  If  you are 63-76 years of age, have your blood pressure checked every 3-5 years. If you are 34 years of age or older, have your blood pressure checked every year. You should have your blood pressure measured twice-once when you are at a hospital or clinic, and once when you are not at a hospital or clinic. Record the average of the two measurements. To check your blood pressure when you are not at a hospital or clinic, you can use: ? An automated blood pressure machine at a pharmacy. ? A home blood pressure monitor.  If you are between 2 years and 24 years old, ask your health care provider if you should take aspirin to prevent strokes.  Have regular diabetes screenings. This involves taking a blood sample to check your fasting blood sugar level. ? If you are at a normal weight and have a low risk for diabetes, have this test once every three years after 66 years of age. ? If you are overweight and have a high risk for diabetes, consider being tested at a younger age or more often. Preventing infection Hepatitis B  If you have a higher risk for hepatitis B, you should be screened for this virus. You are considered at high risk for hepatitis B if: ? You were born in a country where hepatitis B is common. Ask your health care provider which countries are considered high risk. ? Your parents were born in a high-risk country, and you have not been immunized against hepatitis B (hepatitis B vaccine). ? You have HIV or AIDS. ? You use needles to inject street drugs. ? You live with someone who has hepatitis B. ? You have had sex with someone who has hepatitis B. ? You get hemodialysis treatment. ? You take certain medicines for conditions, including cancer, organ transplantation, and autoimmune conditions.  Hepatitis C  Blood testing is recommended for: ? Everyone born from [birth year] through 1964. ? Anyone with known risk factors for hepatitis C.  Sexually transmitted infections (STIs)  You should  be screened for sexually transmitted infections (STIs) including gonorrhea and chlamydia if: ? You are sexually active and are younger than 66 years of age. ? You are older than 66 years of age and your health care provider tells you that you are at risk for this type of infection. ? Your sexual activity has changed since you were last screened and you are at an  increased risk for chlamydia or gonorrhea. Ask your health care provider if you are at risk.  If you do not have HIV, but are at risk, it may be recommended that you take a prescription medicine daily to prevent HIV infection. This is called pre-exposure prophylaxis (PrEP). You are considered at risk if: ? You are sexually active and do not regularly use condoms or know the HIV status of your partner(s). ? You take drugs by injection. ? You are sexually active with a partner who has HIV.  Talk with your health care provider about whether you are at high risk of being infected with HIV. If you choose to begin PrEP, you should first be tested for HIV. You should then be tested every 3 months for as long as you are taking PrEP. Pregnancy  If you are premenopausal and you may become pregnant, ask your health care provider about preconception counseling.  If you may become pregnant, take 400 to 800 micrograms (mcg) of folic acid every day.  If you want to prevent pregnancy, talk to your health care provider about birth control (contraception). Osteoporosis and menopause  Osteoporosis is a disease in which the bones lose minerals and strength with aging. This can result in serious bone fractures. Your risk for osteoporosis can be identified using a bone density scan.  If you are 68 years of age or older, or if you are at risk for osteoporosis and fractures, ask your health care provider if you should be screened.  Ask your health care provider whether you should take a calcium or vitamin D supplement to lower your risk for  osteoporosis.  Menopause may have certain physical symptoms and risks.  Hormone replacement therapy may reduce some of these symptoms and risks. Talk to your health care provider about whether hormone replacement therapy is right for you. Follow these instructions at home:  Schedule regular health, dental, and eye exams.  Stay current with your immunizations.  Do not use any tobacco products including cigarettes, chewing tobacco, or electronic cigarettes.  If you are pregnant, do not drink alcohol.  If you are breastfeeding, limit how much and how often you drink alcohol.  Limit alcohol intake to no more than 1 drink per day for nonpregnant women. One drink equals 12 ounces of beer, 5 ounces of wine, or 1 ounces of hard liquor.  Do not use street drugs.  Do not share needles.  Ask your health care provider for help if you need support or information about quitting drugs.  Tell your health care provider if you often feel depressed.  Tell your health care provider if you have ever been abused or do not feel safe at home. This information is not intended to replace advice given to you by your health care provider. Make sure you discuss any questions you have with your health care provider. Document Released: 06/30/2011 Document Revised: 05/22/2016 Document Reviewed: 09/18/2015 Elsevier Interactive Patient Education  2018 [employer] Maintenance  Topic Date Due  . MAMMOGRAM  02/16/2019  . COLONOSCOPY  02/03/2021  . TETANUS/TDAP  09/18/2027  . INFLUENZA VACCINE  Completed  . DEXA SCAN  Completed  . Hepatitis C Screening  Completed  . PNA vac Low Risk Adult  Completed  DEXA scan due 02/17/2019  You can use Afrin for only 3 days

## 2018-03-19 NOTE — Assessment & Plan Note (Signed)
Upper left side back/shoulder; benign, seeing derm

## 2018-03-19 NOTE — Progress Notes (Signed)
Patient: Lynn Sandoval, Female    DOB: August 29, 1951, 67 y.o.   MRN: [id number]  Visit Date: 03/19/2018  Today's Provider: Enid Derry, MD   Chief Complaint  Patient presents with  . Medicare Wellness    Subjective:   Lynn Sandoval is a 67 y.o. female who presents today for her Subsequent Annual Wellness Visit.  Patient is here for f/u; since last visit, had choly and appy; feels just great today; had a melanoma taken off in February 2019; melanoma right arm  USPSTF grade A and B recommendations Depression:  Depression screen [facility] 2/9 03/19/2018 09/17/2017 02/13/2017 07/21/2016 01/21/2016  Decreased Interest 0 0 0 0 0  Down, Depressed, Hopeless 0 0 0 0 1  PHQ - 2 Score 0 0 0 0 1  Altered sleeping - - - - -  Tired, decreased energy - - - - -  Change in appetite - - - - -  Feeling bad or failure about yourself  - - - - -  Trouble concentrating - - - - -  Moving slowly or fidgety/restless - - - - -  Suicidal thoughts - - - - -  PHQ-9 Score - - - - -  Difficult doing work/chores - - - - -   Hypertension: BP Readings from Last 3 Encounters:  03/13/18 129/61  10/22/17 (!) 150/88  10/13/17 139/86   Obesity: Wt Readings from Last 3 Encounters:  03/19/18 152 lb 12.8 oz (69.3 kg)  03/13/18 198 lb 3.2 oz (89.9 kg)  10/22/17 155 lb (70.3 kg)  MD Note: she did NOT weigh 198 pounds just last week  BMI Readings from Last 3 Encounters:  03/19/18 26.64 kg/m  03/13/18 34.02 kg/m  10/22/17 26.61 kg/m     Skin cancer: seeing Dr. Nicole Kindred; going every six months; next appt Aug; no new moles Lung cancer:  nonsmoker Breast cancer: Feb 2019 Colorectal cancer: 2017; next in 2022 Cervical cancer screening: done, graduated; no hx of abnormal HIV, hep B, hep C: politely declined STD testing and prevention (chl/gon/syphilis): politley declined Intimate partner violence: no abuse Osteoporosis: in 2020 Fall prevention/vitamin D: discussed Immunizations: PCV-13 last year; PPSV-23 this  year; had the old shingles vaccine; informed about new Diet: adquate fiber, calcium intake Exercise: goes to the gym, silver sneakers, "it's free" Alcohol: socially, couple of glasses of wine a week Tobacco use: never AAA: no fam hx Aspirin: taking 81 mg daily Glucose:  Glucose, Bld  Date Value Ref Range Status  03/12/2018 106 (H) 65 - 99 mg/dL Final  09/17/2017 93 65 - 99 mg/dL Final    Comment:    .            Fasting reference interval .   03/26/2017 87 65 - 99 mg/dL Final   Lipids:  Lab Results  Component Value Date   CHOL 178 03/26/2017   CHOL 183 09/23/2016   CHOL 309 (H) 07/22/2016   Lab Results  Component Value Date   HDL 79 03/26/2017   HDL 80 09/23/2016   HDL 81 07/22/2016   Lab Results  Component Value Date   LDLCALC 86 03/26/2017   LDLCALC 91 09/23/2016   LDLCALC 209 (H) 07/22/2016   Lab Results  Component Value Date   TRIG 64 03/26/2017   TRIG 58 09/23/2016   TRIG 95 07/22/2016   Lab Results  Component Value Date   CHOLHDL 2.3 03/26/2017   CHOLHDL 2.3 09/23/2016   CHOLHDL 3.8 07/22/2016   No results found for:  LDLDIRECT  Review of Systems  Past Medical History:  Diagnosis Date  . Arthritis    fingers  . Cancer ([place])   . Colon polyps   . Family history of adverse reaction to anesthesia    son - slow to wake  . H/O dysplastic nevus 02/13/2017   Followed by Dr. Brendolyn Patty  . Heart murmur    GRADE 1 DIASTOLIC MURMUR PER ECHO IN MARCH 2018  . Hyperlipidemia   . Hypothyroidism   . Motion sickness    boats  . Osteopenia 02/25/2017   DEXA Feb 2018  . Thyroid disease     Past Surgical History:  Procedure Laterality Date  . [place]  . CHOLECYSTECTOMY N/A 10/13/2017   Procedure: LAPAROSCOPIC CHOLECYSTECTOMY;  Surgeon: Jules Husbands, MD;  Location: [facility];  Service: General;  Laterality: N/A;  . COLONOSCOPY WITH PROPOFOL N/A 02/04/2016   Procedure: COLONOSCOPY WITH PROPOFOL;  Surgeon: Lucilla Lame, MD;   Location: [place];  Service: Endoscopy;  Laterality: N/A;  . EYE SURGERY  1955  . LAPAROSCOPIC APPENDECTOMY N/A 03/13/2018   Procedure: APPENDECTOMY LAPAROSCOPIC;  Surgeon: Robert Bellow, MD;  Location: [facility];  Service: General;  Laterality: N/A;  . POLYPECTOMY  02/04/2016   Procedure: POLYPECTOMY;  Surgeon: Lucilla Lame, MD;  Location: [place];  Service: Endoscopy;;  . SKIN SURGERY Right 01/2018   melanoma removalm right arm  . TONSILLECTOMY  1960    Family History  Problem Relation Age of Onset  . Depression Mother   . Hearing loss Mother   . Hyperlipidemia Mother   . Hypertension Mother   . Glaucoma Mother   . Cancer Mother        melanoma  . Heart disease Father   . Heart attack Father   . Diabetes Maternal Grandmother   . Heart attack Maternal Grandmother   . Cancer Paternal Grandmother        colon  . Breast cancer Neg Hx   seeing eye doctor regular Colon cancer - every 5 years Heart attack - taking aspirin  Social History   Tobacco Use  . Smoking status: Never Smoker  . Smokeless tobacco: Never Used  Substance Use Topics  . Alcohol use: Yes    Alcohol/week: 1.8 oz    Types: 3 Glasses of wine per week    Comment: ocassional glass of wine  . Drug use: No    Outpatient Encounter Medications as of 03/19/2018  Medication Sig  . acetaminophen (TYLENOL) 325 MG tablet Take 650 mg by mouth every 6 (six) hours as needed for mild pain or headache.  . ASPIRIN 81 PO Take 1 tablet by mouth daily.  Marland Kitchen atorvastatin (LIPITOR) 20 MG tablet TAKE 1 TABLET BY MOUTH AT  BEDTIME  . Calcium Carbonate-Vitamin D (CALCIUM 500 + D PO) Take 1 tablet by mouth daily.  Marland Kitchen levothyroxine (SYNTHROID, LEVOTHROID) 75 MCG tablet TAKE 1 TABLET BY MOUTH  DAILY BEFORE BREAKFAST  . Omega-3 Fatty Acids (FISH OIL) 1000 MG CAPS Take 1,000 mg by mouth daily.  Marland Kitchen Propylene Glycol (SYSTANE BALANCE OP) Apply 1 drop to eye daily.  . [DISCONTINUED] HYDROcodone-acetaminophen  (NORCO/VICODIN) 5-325 MG tablet Take 1 tablet by mouth every 4 (four) hours as needed for moderate pain.  . [DISCONTINUED] Melatonin 5 MG TABS Take 1 tablet by mouth at bedtime.   No facility-administered encounter medications on file as of 03/19/2018.     Functional Ability / Safety Screening 1.  Was the timed Get Up and Go test less than 12 seconds?  yes; working on quads at the gym 2.  Does the patient need help with the phone, transportation, shopping,      preparing meals, housework, laundry, medications, or managing money?  no 3.  Does the patient's home have:  loose throw rugs in the hallway?   no      Grab bars in the bathroom? no, other things to hold      Handrails on the stairs?   yes      Good lighting?   yes 4.  Has the patient noticed any hearing difficulties?   no  Advanced Directives Does patient have a HCPOA?    no, keeps putting it off If yes, name and contact information: husband, Remo Lipps [phone] Does patient have a living will or MOST form?  no  She has not specifically discussed end of life issues   Immunizations: above  Fall Risk Assessment See under rooming  Depression Screen See under rooming Depression screen [facility] 2/9 03/19/2018 09/17/2017 02/13/2017 07/21/2016 01/21/2016  Decreased Interest 0 0 0 0 0  Down, Depressed, Hopeless 0 0 0 0 1  PHQ - 2 Score 0 0 0 0 1  Altered sleeping - - - - -  Tired, decreased energy - - - - -  Change in appetite - - - - -  Feeling bad or failure about yourself  - - - - -  Trouble concentrating - - - - -  Moving slowly or fidgety/restless - - - - -  Suicidal thoughts - - - - -  PHQ-9 Score - - - - -  Difficult doing work/chores - - - - -    Objective:   Vitals: Pulse 80   Temp 97.6 F (36.4 C) (Oral)   Ht 5' 3.5" (1.613 m)   Wt 152 lb 12.8 oz (69.3 kg)   SpO2 98%   BMI 26.64 kg/m  Body mass index is 26.64 kg/m. No exam data present  Physical Exam Mood/affect:   Appearance:    No flowsheet data  found.  Assessment & Plan:     Annual Wellness Visit  Reviewed patient's Family Medical History Reviewed and updated list of patient's medical providers Assessment of cognitive impairment was done Assessed patient's functional ability Established a written schedule for health screening [place] Completed and Reviewed  Exercise Activities and Dietary recommendations Goals    None      Immunization History  Administered Date(s) Administered  . Influenza, High Dose Seasonal PF 09/17/2017  . Influenza,inj,Quad PF,6+ Mos 01/21/2016  . Pneumococcal Conjugate-13 02/13/2017  . Tdap 09/17/2017  . Zoster 08/26/2013    Health Maintenance  Topic Date Due  . MAMMOGRAM  02/17/2020  . COLONOSCOPY  02/03/2021  . TETANUS/TDAP  09/18/2027  . INFLUENZA VACCINE  Completed  . DEXA SCAN  Completed  . Hepatitis C Screening  Completed  . PNA vac Low Risk Adult  Completed    Discussed health benefits of physical activity, and encouraged her to engage in regular exercise appropriate for her age and condition.   No orders of the defined types were placed in this encounter.   Current Outpatient Medications:  .  acetaminophen (TYLENOL) 325 MG tablet, Take 650 mg by mouth every 6 (six) hours as needed for mild pain or headache., Disp: , Rfl:  .  ASPIRIN 81 PO, Take 1 tablet by mouth daily., Disp: , Rfl:  .  atorvastatin (LIPITOR)  20 MG tablet, TAKE 1 TABLET BY MOUTH AT  BEDTIME, Disp: 90 tablet, Rfl: 1 .  Calcium Carbonate-Vitamin D (CALCIUM 500 + D PO), Take 1 tablet by mouth daily., Disp: , Rfl:  .  levothyroxine (SYNTHROID, LEVOTHROID) 75 MCG tablet, TAKE 1 TABLET BY MOUTH  DAILY BEFORE BREAKFAST, Disp: 90 tablet, Rfl: 0 .  Omega-3 Fatty Acids (FISH OIL) 1000 MG CAPS, Take 1,000 mg by mouth daily., Disp: , Rfl:  .  Propylene Glycol (SYSTANE BALANCE OP), Apply 1 drop to eye daily., Disp: , Rfl:  Medications Discontinued During This Encounter  Medication Reason  .  Melatonin 5 MG TABS Completed Course  . HYDROcodone-acetaminophen (NORCO/VICODIN) 5-325 MG tablet Patient Preference    Next Medicare Wellness Visit in 12+ months  Problem List Items Addressed This Visit    None    Visit Diagnoses    Need for pneumococcal vaccination    -  Primary   Relevant Orders   Pneumococcal polysaccharide vaccine 23-valent greater than or equal to 2yo subcutaneous/IM   Encounter for Medicare annual wellness exam

## 2018-03-19 NOTE — Assessment & Plan Note (Signed)
Removed by dermatologist; going every xix months

## 2018-03-23 ENCOUNTER — Telehealth: Payer: Self-pay

## 2018-03-23 NOTE — Telephone Encounter (Signed)
Message left for the patient to call back to update [place] on how she is doing.

## 2018-03-23 NOTE — Telephone Encounter (Signed)
-----   Message from Robert Bellow, MD sent at 03/22/2018  4:57 PM EDT ----- I did appendectomy on March 15th. F/U with [facility] set for April 2. Just give her a call to see how she is doing.  She should have already been notified about her pathology (appendicitis) Thanks.

## 2018-03-23 NOTE — Telephone Encounter (Signed)
Patient reports that she is doing well and almost 100 percent. She is aware of her pathology.

## 2018-03-29 ENCOUNTER — Encounter: Payer: Self-pay | Admitting: Surgery

## 2018-03-29 ENCOUNTER — Ambulatory Visit (INDEPENDENT_AMBULATORY_CARE_PROVIDER_SITE_OTHER): Payer: Medicare Other | Admitting: Surgery

## 2018-03-29 VITALS — BP 162/89 | HR 76 | Temp 98.2°F | Ht 65.0 in | Wt 157.6 lb

## 2018-03-29 DIAGNOSIS — K3532 Acute appendicitis with perforation and localized peritonitis, without abscess: Secondary | ICD-10-CM

## 2018-03-29 DIAGNOSIS — Z4889 Encounter for other specified surgical aftercare: Secondary | ICD-10-CM

## 2018-03-29 NOTE — Patient Instructions (Signed)

## 2018-03-29 NOTE — Progress Notes (Signed)
Surgical Clinic Progress/Follow-up Note   HPI:  67 y.o. Female presents to clinic for post-op follow-up evaluation s/p laparoscopic appendectomy for acute appendicitis. Patient reports complete resolution of abdominal pain and has been tolerating regular diet with +flatus and normal BM's, denies N/V, fever/chills, CP, or SOB.  Review of Systems:  Constitutional: denies any other weight loss, fever, chills, or sweats  Eyes: denies any other vision changes, history of eye injury  ENT: denies sore throat, hearing problems  Respiratory: denies shortness of breath, wheezing  Cardiovascular: denies chest pain, palpitations  Gastrointestinal: abdominal pain, N/V, and bowel function as per HPI Musculoskeletal: denies any other joint pains or cramps  Skin: Denies any other rashes or skin discolorations  Neurological: denies any other headache, dizziness, weakness  Psychiatric: denies any other depression, anxiety  All other review of systems: otherwise negative   Vital Signs:  BP (!) 162/89   Pulse 76   Temp 98.2 F (36.8 C) (Oral)   Ht 5\' 5"  (1.651 m)   Wt 157 lb 9.6 oz (71.5 kg)   BMI 26.23 kg/m    Physical Exam:  Constitutional:  -- Normal body habitus  -- Awake, alert, and oriented x3  Eyes:  -- Pupils equally round and reactive to light  -- No scleral icterus  Ear, nose, throat:  -- No jugular venous distension  -- No nasal drainage, bleeding Pulmonary:  -- No crackles -- Equal breath sounds bilaterally -- Breathing non-labored at rest Cardiovascular:  -- S1, S2 present  -- No pericardial rubs  Gastrointestinal:  -- Soft, nontender, non-distended, no guarding/rebound -- Incisions well-approximated without surrounding erythema or drainage -- No abdominal masses appreciated, pulsatile or otherwise  Musculoskeletal / Integumentary:  -- Wounds or skin discoloration: None appreciated except post-surgical wounds as described above (GI) -- Extremities: B/L UE and LE FROM,  hands and feet warm, no edema  Neurologic:  -- Motor function: intact and symmetric  -- Sensation: intact and symmetric   Assessment:  67 y.o. yo Female with a problem list including...  Patient Active Problem List   Diagnosis Date Noted  . Seborrheic keratosis 03/19/2018  . Appendicitis, acute 03/13/2018  . Melanoma in situ ([place]) 02/01/2018  . Gallstone 09/24/2017  . Abdominal pain 09/17/2017  . Osteopenia 02/25/2017  . Encounter for hepatitis C screening test for low risk patient 02/13/2017  . Left atrial enlargement 02/13/2017  . H/O dysplastic nevus 02/13/2017  . Medication monitoring encounter 07/31/2016  . Family hx of melanoma 07/21/2016  . Personal history of colonic polyps   . Needs flu shot 01/21/2016  . Annual physical exam 01/21/2016  . Encounter for screening mammogram for malignant neoplasm of breast 01/21/2016  . Encounter for screening for cervical cancer  01/21/2016  . Hyperlipidemia LDL goal <100 07/29/2015  . Hypothyroidism, adult 07/29/2015  . Adenomatous colon polyp 07/29/2015    presents to clinic for post-op follow-up evaluation, doing well s/p laparoscopic appendectomy.  Plan:              - advance diet as tolerated              - okay to submerge incisions under water (baths, swimming) prn             - gradually resume all activities without restrictions over next 2 weeks             - apply sunblock particularly to incisions with sun exposure to reduce pigmentation of scars             -  return to clinic as needed, instructed to call office if any questions or concerns   All of the above recommendations were discussed with the patient, and all of patient's questions were answered to her expressed satisfaction.  -- Marilynne Drivers Rosana Hoes, MD, [place]: [facility] - Partnering for exceptional care. Office: [phone]

## 2018-05-17 ENCOUNTER — Other Ambulatory Visit: Payer: Self-pay

## 2018-05-17 ENCOUNTER — Other Ambulatory Visit: Payer: Self-pay | Admitting: Family Medicine

## 2018-05-17 DIAGNOSIS — Z5181 Encounter for therapeutic drug level monitoring: Secondary | ICD-10-CM

## 2018-05-17 DIAGNOSIS — E785 Hyperlipidemia, unspecified: Secondary | ICD-10-CM

## 2018-05-17 DIAGNOSIS — E039 Hypothyroidism, unspecified: Secondary | ICD-10-CM

## 2018-05-17 NOTE — Telephone Encounter (Signed)
Pt.notified

## 2018-05-17 NOTE — Telephone Encounter (Signed)
Lynn Sandoval, please ask patient to get labs soon; I've entered orders; thank you

## 2018-05-21 DIAGNOSIS — E785 Hyperlipidemia, unspecified: Secondary | ICD-10-CM | POA: Diagnosis not present

## 2018-05-21 DIAGNOSIS — Z5181 Encounter for therapeutic drug level monitoring: Secondary | ICD-10-CM | POA: Diagnosis not present

## 2018-05-21 DIAGNOSIS — E039 Hypothyroidism, unspecified: Secondary | ICD-10-CM | POA: Diagnosis not present

## 2018-05-21 LAB — COMPLETE METABOLIC PANEL WITH GFR
AG Ratio: 2.2 (calc) (ref 1.0–2.5)
ALT: 25 U/L (ref 6–29)
AST: 22 U/L (ref 10–35)
Albumin: 4.3 g/dL (ref 3.6–5.1)
Alkaline phosphatase (APISO): 115 U/L (ref 33–130)
BUN: 13 mg/dL (ref 7–25)
CO2: 27 mmol/L (ref 20–32)
Calcium: 8.9 mg/dL (ref 8.6–10.4)
Chloride: 105 mmol/L (ref 98–110)
Creat: 0.74 mg/dL (ref 0.50–0.99)
GFR, Est African American: 98 mL/min/{1.73_m2} (ref >=60)
GFR, Est Non African American: 84 mL/min/{1.73_m2} (ref >=60)
Globulin: 2 g/dL (calc) (ref 1.9–3.7)
Glucose, Bld: 89 mg/dL (ref 65–99)
Potassium: 4.3 mmol/L (ref 3.5–5.3)
Sodium: 141 mmol/L (ref 135–146)
Total Bilirubin: 0.5 mg/dL (ref 0.2–1.2)
Total Protein: 6.3 g/dL (ref 6.1–8.1)

## 2018-05-21 LAB — TSH: TSH: 1.8 mIU/L (ref 0.40–4.50)

## 2018-05-21 LAB — LIPID PANEL
Cholesterol: 184 mg/dL (ref <200)
HDL: 74 mg/dL (ref >50)
LDL Cholesterol (Calc): 94 mg/dL (calc)
Non-HDL Cholesterol (Calc): 110 mg/dL (calc) (ref <130)
Total CHOL/HDL Ratio: 2.5 (calc) (ref <5.0)
Triglycerides: 71 mg/dL (ref <150)

## 2018-08-17 DIAGNOSIS — L821 Other seborrheic keratosis: Secondary | ICD-10-CM | POA: Diagnosis not present

## 2018-08-17 DIAGNOSIS — Z85820 Personal history of malignant melanoma of skin: Secondary | ICD-10-CM | POA: Diagnosis not present

## 2018-08-17 DIAGNOSIS — D229 Melanocytic nevi, unspecified: Secondary | ICD-10-CM | POA: Diagnosis not present

## 2018-08-17 DIAGNOSIS — D485 Neoplasm of uncertain behavior of skin: Secondary | ICD-10-CM | POA: Diagnosis not present

## 2018-08-17 DIAGNOSIS — Z1283 Encounter for screening for malignant neoplasm of skin: Secondary | ICD-10-CM | POA: Diagnosis not present

## 2018-08-27 ENCOUNTER — Other Ambulatory Visit: Payer: Self-pay | Admitting: Family Medicine

## 2018-08-27 DIAGNOSIS — E039 Hypothyroidism, unspecified: Secondary | ICD-10-CM

## 2018-08-27 NOTE — Telephone Encounter (Signed)
Last Ov:03/25/17 Next 3/20

## 2018-09-07 DIAGNOSIS — H2513 Age-related nuclear cataract, bilateral: Secondary | ICD-10-CM | POA: Diagnosis not present

## 2018-10-27 ENCOUNTER — Ambulatory Visit (INDEPENDENT_AMBULATORY_CARE_PROVIDER_SITE_OTHER): Payer: Medicare Other

## 2018-10-27 DIAGNOSIS — Z23 Encounter for immunization: Secondary | ICD-10-CM

## 2018-12-01 ENCOUNTER — Other Ambulatory Visit: Payer: Self-pay | Admitting: Nurse Practitioner

## 2018-12-01 ENCOUNTER — Other Ambulatory Visit: Payer: Self-pay | Admitting: Family Medicine

## 2018-12-01 DIAGNOSIS — E039 Hypothyroidism, unspecified: Secondary | ICD-10-CM

## 2018-12-01 NOTE — Telephone Encounter (Signed)
Lab Results  Component Value Date   TSH 1.80 05/21/2018

## 2018-12-01 NOTE — Telephone Encounter (Signed)
Lab Results  Component Value Date   ALT 25 05/21/2018   Lab Results  Component Value Date   CHOL 184 05/21/2018   HDL 74 05/21/2018   LDLCALC 94 05/21/2018   TRIG 71 05/21/2018   CHOLHDL 2.5 05/21/2018

## 2019-01-06 ENCOUNTER — Other Ambulatory Visit: Payer: Self-pay | Admitting: Family Medicine

## 2019-01-06 DIAGNOSIS — Z1231 Encounter for screening mammogram for malignant neoplasm of breast: Secondary | ICD-10-CM

## 2019-02-17 ENCOUNTER — Ambulatory Visit
Admission: RE | Admit: 2019-02-17 | Discharge: 2019-02-17 | Disposition: A | Discharge status: Home / Self Care | Payer: Medicare Other | Source: Ambulatory Visit | Attending: Family Medicine | Admitting: Family Medicine

## 2019-02-17 DIAGNOSIS — Z1231 Encounter for screening mammogram for malignant neoplasm of breast: Secondary | ICD-10-CM | POA: Diagnosis not present

## 2019-02-28 DIAGNOSIS — Z85820 Personal history of malignant melanoma of skin: Secondary | ICD-10-CM | POA: Diagnosis not present

## 2019-02-28 DIAGNOSIS — L728 Other follicular cysts of the skin and subcutaneous tissue: Secondary | ICD-10-CM | POA: Diagnosis not present

## 2019-02-28 DIAGNOSIS — D229 Melanocytic nevi, unspecified: Secondary | ICD-10-CM | POA: Diagnosis not present

## 2019-02-28 DIAGNOSIS — D485 Neoplasm of uncertain behavior of skin: Secondary | ICD-10-CM | POA: Diagnosis not present

## 2019-02-28 DIAGNOSIS — Z1283 Encounter for screening for malignant neoplasm of skin: Secondary | ICD-10-CM | POA: Diagnosis not present

## 2019-03-21 ENCOUNTER — Ambulatory Visit: Payer: Medicare Other | Admitting: Family Medicine

## 2019-03-24 ENCOUNTER — Other Ambulatory Visit: Payer: Self-pay

## 2019-03-24 ENCOUNTER — Ambulatory Visit (INDEPENDENT_AMBULATORY_CARE_PROVIDER_SITE_OTHER): Payer: Medicare Other

## 2019-03-24 VITALS — BP 140/70 | HR 70 | Temp 97.3°F | Ht 65.0 in | Wt 162.6 lb

## 2019-03-24 DIAGNOSIS — M810 Age-related osteoporosis without current pathological fracture: Secondary | ICD-10-CM | POA: Diagnosis not present

## 2019-03-24 DIAGNOSIS — Z780 Asymptomatic menopausal state: Secondary | ICD-10-CM | POA: Diagnosis not present

## 2019-03-24 DIAGNOSIS — M8580 Other specified disorders of bone density and structure, unspecified site: Secondary | ICD-10-CM

## 2019-03-24 DIAGNOSIS — Z0000 Encounter for general adult medical examination without abnormal findings: Secondary | ICD-10-CM

## 2019-03-24 NOTE — Progress Notes (Signed)
Subjective:   Lynn Sandoval is a 68 y.o. female who presents for Medicare Annual (Subsequent) preventive examination.  This visit is being conducted via telephone due to the COVID-19 pandemic. This patient has given me verbal consent via telephone to conduct this visit. Some vital signs may be absent or patient reported.   Review of Systems:   Cardiac Risk Factors include: advanced age (>46men, >38 women);dyslipidemia;hypertension     Objective:     Vitals: BP 140/70   Pulse 70   Temp (!) 97.3 F (36.3 C) (Oral)   Ht 5\' 5"  (1.651 m)   Wt 162 lb 9.6 oz (73.8 kg)   BMI 27.06 kg/m   Body mass index is 27.06 kg/m.  Advanced Directives 03/24/2019 03/13/2018 03/13/2018 03/12/2018 10/13/2017 10/06/2017 09/17/2017  Does Patient Have a Medical Advance Directive? No No No No No No No  Would patient like information on creating a medical advance directive? Yes (MAU/Ambulatory/Procedural Areas - Information given) - No - Patient declined No - Patient declined Yes (MAU/Ambulatory/Procedural Areas - Information given) - -    Tobacco Social History   Tobacco Use  Smoking Status Never Smoker  Smokeless Tobacco Never Used     Counseling given: Not Answered   Clinical Intake:  Pre-visit preparation completed: Yes  Pain : No/denies pain     BMI - recorded: 27.06 Nutritional Status: BMI 25 -29 Overweight Nutritional Risks: None Diabetes: No  How often do you need to have someone help you when you read instructions, pamphlets, or other written materials from your doctor or pharmacy?: 1 - Never  Interpreter Needed?: No  Information entered by :: Clemetine Marker LPN  Past Medical History:  Diagnosis Date  . Appendicitis, acute 03/13/2018  . Arthritis    fingers  . Cancer ([place])    melanoma  . Colon polyps   . Family history of adverse reaction to anesthesia    son - slow to wake  . Gallstone 09/24/2017   [place] Sept 2018 1.8 cm  . H/O dysplastic nevus 02/13/2017   Followed by Dr.  Brendolyn Patty  . Heart murmur    GRADE 1 DIASTOLIC MURMUR PER ECHO IN MARCH 2018  . Hyperlipidemia   . Hypothyroidism   . Motion sickness    boats  . Osteopenia 02/25/2017   DEXA Feb 2018  . Thyroid disease    Past Surgical History:  Procedure Laterality Date  . APPENDECTOMY  March 2019  . [place]  . CHOLECYSTECTOMY N/A 10/13/2017   Procedure: LAPAROSCOPIC CHOLECYSTECTOMY;  Surgeon: Jules Husbands, MD;  Location: [facility];  Service: General;  Laterality: N/A;  . COLONOSCOPY WITH PROPOFOL N/A 02/04/2016   Procedure: COLONOSCOPY WITH PROPOFOL;  Surgeon: Lucilla Lame, MD;  Location: [place];  Service: Endoscopy;  Laterality: N/A;  . EYE SURGERY  1955  . LAPAROSCOPIC APPENDECTOMY N/A 03/13/2018   Procedure: APPENDECTOMY LAPAROSCOPIC;  Surgeon: Robert Bellow, MD;  Location: [facility];  Service: General;  Laterality: N/A;  . POLYPECTOMY  02/04/2016   Procedure: POLYPECTOMY;  Surgeon: Lucilla Lame, MD;  Location: [place];  Service: Endoscopy;;  . SKIN SURGERY Right 01/2018   melanoma removalm right arm  . TONSILLECTOMY  1960  . TUBAL LIGATION  1985   Family History  Problem Relation Age of Onset  . Depression Mother   . Hearing loss Mother   . Hyperlipidemia Mother   . Hypertension Mother   . Glaucoma Mother   . Cancer  Mother        melanoma  . COPD Mother   . Heart disease Father   . Heart attack Father   . Hearing loss Father   . Diabetes Maternal Grandmother   . Heart attack Maternal Grandmother   . Cancer Paternal Grandmother        colon  . Breast cancer Neg Hx    Social History   Socioeconomic History  . Marital status: Married    Spouse name: Not on file  . Number of children: 4  . Years of education: Not on file  . Highest education level: Master's degree (e.g., MA, MS, MEng, MEd, MSW, MBA)  Occupational History  . Occupation: retired  Scientific laboratory technician  . Financial resource strain: Not hard at all  . Food  insecurity:    Worry: Never true    Inability: Never true  . Transportation needs:    Medical: No    Non-medical: No  Tobacco Use  . Smoking status: Never Smoker  . Smokeless tobacco: Never Used  Substance and Sexual Activity  . Alcohol use: Yes    Alcohol/week: 3.0 standard drinks    Types: 3 Glasses of wine per week    Comment: ocassional glass of wine  . Drug use: No  . Sexual activity: Not Currently    Birth control/protection: None  Lifestyle  . Physical activity:    Days per week: 5 days    Minutes per session: 60 min  . Stress: Not at all  Relationships  . Social connections:    Talks on phone: More than three times a week    Gets together: Three times a week    Attends religious service: More than 4 times per year    Active member of club or organization: No    Attends meetings of clubs or organizations: Never    Relationship status: Married  Other Topics Concern  . Not on file  Social History Narrative  . Not on file    Outpatient Encounter Medications as of 03/24/2019  Medication Sig  . acetaminophen (TYLENOL) 325 MG tablet Take 650 mg by mouth every 6 (six) hours as needed for mild pain or headache.  . ASPIRIN 81 PO Take 1 tablet by mouth daily.  Marland Kitchen atorvastatin (LIPITOR) 20 MG tablet TAKE 1 TABLET BY MOUTH AT  BEDTIME  . Calcium Carbonate-Vitamin D (CALCIUM 500 + D PO) Take 1 tablet by mouth daily.  Marland Kitchen levothyroxine (SYNTHROID, LEVOTHROID) 75 MCG tablet TAKE 1 TABLET BY MOUTH  DAILY BEFORE BREAKFAST  . Omega-3 Fatty Acids (FISH OIL) 1000 MG CAPS Take 1,000 mg by mouth daily.  Marland Kitchen Propylene Glycol (SYSTANE BALANCE OP) Apply 1 drop to eye daily.  . fluticasone (FLONASE) 50 MCG/ACT nasal spray Place 2 sprays into both nostrils daily. (Patient not taking: Reported on 03/24/2019)   No facility-administered encounter medications on file as of 03/24/2019.     Activities of Daily Living In your present state of health, do you have any difficulty performing the  following activities: 03/24/2019  Hearing? N  Comment declines hearing  Vision? N  Comment wears glasses  Difficulty concentrating or making decisions? N  Walking or climbing stairs? N  Dressing or bathing? N  Doing errands, shopping? N  Preparing Food and eating ? N  Using the Toilet? N  In the past six months, have you accidently leaked urine? Y  Comment urge incontinence very rarely when sneezing  Do you have problems with loss of bowel  control? N  Managing your Medications? N  Managing your Finances? N  Housekeeping or managing your Housekeeping? N  Some recent data might be hidden    Patient Care Team: Lada, Satira Anis, MD as PCP - General (Family Medicine) Lucilla Lame, MD as Consulting Physician (Gastroenterology) Brendolyn Patty, MD (Dermatology) Jules Husbands, MD as Consulting Physician (General Surgery) Bary Castilla Forest Gleason, MD (General Surgery)    Assessment:   This is a routine wellness examination for Lynn Sandoval.  Exercise Activities and Dietary recommendations Current Exercise Habits: Structured exercise class;Home exercise routine, Type of exercise: walking;strength training/weights;calisthenics;Other - see comments(trainer once a week), Time (Minutes): 60, Frequency (Times/Week): 5, Weekly Exercise (Minutes/Week): 300, Intensity: Moderate, Exercise limited by: None identified  Goals    . DIET - EAT MORE FRUITS AND VEGETABLES (pt-stated)       Fall Risk Fall Risk  03/24/2019 03/19/2018 09/17/2017 02/13/2017 07/21/2016  Falls in the past year? 0 No No No No  Number falls in past yr: 0 - - - -  Injury with Fall? 0 - - - -  Follow up Falls prevention discussed - - - -   FALL RISK PREVENTION PERTAINING TO THE HOME:  Any stairs in or around the home? Yes  If so, do they handrails? Yes   Home free of loose throw rugs in walkways, pet beds, electrical cords, etc? Yes  Adequate lighting in your home to reduce risk of falls? Yes   ASSISTIVE DEVICES UTILIZED TO PREVENT  FALLS:  Life alert? No  Use of a cane, walker or w/c? No  Grab bars in the bathroom? No  Shower chair or bench in shower? No  Elevated toilet seat or a handicapped toilet? No   DME ORDERS:  DME order needed?  No   TIMED UP AND GO:  Was the test performed? No . Telephonic visit  Length of time to ambulate 10 feet:  sec.   Education: Fall risk prevention has been discussed.  Intervention(s) required? No   Depression Screen PHQ 2/9 Scores 03/24/2019 03/19/2018 09/17/2017 02/13/2017  PHQ - 2 Score 0 0 0 0  PHQ- 9 Score 0 - - -     Cognitive Function     6CIT Screen 03/24/2019  What Year? 0 points  What month? 0 points  What time? 0 points  Count back from 20 0 points  Months in reverse 0 points  Repeat phrase 0 points  Total Score 0    Immunization History  Administered Date(s) Administered  . Influenza, High Dose Seasonal PF 09/17/2017, 10/27/2018  . Influenza,inj,Quad PF,6+ Mos 01/21/2016  . Pneumococcal Conjugate-13 02/13/2017  . Pneumococcal Polysaccharide-23 03/19/2018  . Tdap 09/17/2017  . Zoster 08/26/2013    Qualifies for Shingles Vaccine? Yes  Zostavax completed 2014. Due for Shingrix. Education has been provided regarding the importance of this vaccine. Pt has been advised to call insurance company to determine out of pocket expense. Advised may also receive vaccine at local pharmacy or Health Dept. Verbalized acceptance and understanding.  Tdap: Up to date  Flu Vaccine: Up to date  Pneumococcal Vaccine: Up to date   Screening Tests Health Maintenance  Topic Date Due  . MAMMOGRAM  02/18/2020  . COLONOSCOPY  02/03/2021  . TETANUS/TDAP  09/18/2027  . INFLUENZA VACCINE  Completed  . DEXA SCAN  Completed  . Hepatitis C Screening  Completed  . PNA vac Low Risk Adult  Completed    Cancer Screenings:  Colorectal Screening: Completed 02/04/16. Repeat every 5 years.  Mammogram: Completed 02/17/19. Repeat every year.  Bone Density: Completed 02/17/17.  Results reflect OSTEOPENIA. Repeat every 2 years. Ordered today. Pt provided with contact information and advised to call to schedule appt.   Lung Cancer Screening: (Low Dose CT Chest recommended if Age 45-80 years, 30 pack-year currently smoking OR have quit w/in 15years.) does not qualify.   Additional Screening:  Hepatitis C Screening: does qualify; Completed 03/26/17.  Vision Screening: Recommended annual ophthalmology exams for early detection of glaucoma and other disorders of the eye. Is the patient up to date with their annual eye exam?  Yes  Who is the provider or what is the name of the office in which the pt attends annual eye exams? [facility]  Dental Screening: Recommended annual dental exams for proper oral hygiene  Community Resource Referral:  CRR required this visit?  No      Plan:     I have personally reviewed and addressed the Medicare Annual Wellness questionnaire and have noted the following in the patient's chart:  A. Medical and social history B. Use of alcohol, tobacco or illicit drugs  C. Current medications and supplements D. Functional ability and status E.  Nutritional status F.  Physical activity G. Advance directives H. List of other physicians I.  Hospitalizations, surgeries, and ER visits in previous 12 months J.  [place] such as hearing and vision if needed, cognitive and depression L. Referrals and appointments   In addition, I have reviewed and discussed with patient certain preventive protocols, quality metrics, and best practice recommendations. A written personalized care plan for preventive services as well as general preventive health recommendations were provided to patient.   Signed,  Clemetine Marker, LPN Nurse Health Advisor   Nurse Notes:

## 2019-03-24 NOTE — Patient Instructions (Signed)
Lynn Sandoval , Thank you for taking time to come for your Medicare Wellness Visit. I appreciate your ongoing commitment to your health goals. Please review the following plan we discussed and let me know if I can assist you in the future.   Screening recommendations/referrals: Colonoscopy: done 02/04/16. Repeat in 2022 Mammogram: done 02/17/19 Bone Density: done 02/17/17. Please call [phone] to schedule your bone density screening.  Recommended yearly ophthalmology/optometry visit for glaucoma screening and checkup Recommended yearly dental visit for hygiene and checkup  Vaccinations: Influenza vaccine: done 10/27/18 Pneumococcal vaccine: done 03/19/18 Tdap vaccine: done 09/17/17 Shingles vaccine: Shingrix discussed. Please contact your pharmacy for coverage information.   Advanced directives: Advance directive discussed with you today. I have mailed a copy for you to complete at home and have notarized. Once this is complete please bring a copy in to our office so we can scan it into your chart.  Conditions/risks identified: Keep up the great work!  Next appointment: Please follow up in one year for your Medicare Annual Wellness visit.   Preventive Care 16 Years and Older, Female Preventive care refers to lifestyle choices and visits with your health care provider that can promote health and wellness. What does preventive care include?  A yearly physical exam. This is also called an annual well check.  Dental exams once or twice a year.  Routine eye exams. Ask your health care provider how often you should have your eyes checked.  Personal lifestyle choices, including:  Daily care of your teeth and gums.  Regular physical activity.  Eating a healthy diet.  Avoiding tobacco and drug use.  Limiting alcohol use.  Practicing safe sex.  Taking low-dose aspirin every day.  Taking vitamin and mineral supplements as recommended by your health care provider. What happens during  an annual well check? The services and screenings done by your health care provider during your annual well check will depend on your age, overall health, lifestyle risk factors, and family history of disease. Counseling  Your health care provider may ask you questions about your:  Alcohol use.  Tobacco use.  Drug use.  Emotional well-being.  Home and relationship well-being.  Sexual activity.  Eating habits.  History of falls.  Memory and ability to understand (cognition).  Work and work Statistician.  Reproductive health. Screening  You may have the following tests or measurements:  Height, weight, and BMI.  Blood pressure.  Lipid and cholesterol levels. These may be checked every 5 years, or more frequently if you are over 97 years old.  Skin check.  Lung cancer screening. You may have this screening every year starting at age 54 if you have a 30-pack-year history of smoking and currently smoke or have quit within the past 15 years.  Fecal occult blood test (FOBT) of the stool. You may have this test every year starting at age 1.  Flexible sigmoidoscopy or colonoscopy. You may have a sigmoidoscopy every 5 years or a colonoscopy every 10 years starting at age 2.  Hepatitis C blood test.  Hepatitis B blood test.  Sexually transmitted disease (STD) testing.  Diabetes screening. This is done by checking your blood sugar (glucose) after you have not eaten for a while (fasting). You may have this done every 1-3 years.  Bone density scan. This is done to screen for osteoporosis. You may have this done starting at age 53.  Mammogram. This may be done every 1-2 years. Talk to your health care provider about how often  you should have regular mammograms. Talk with your health care provider about your test results, treatment options, and if necessary, the need for more tests. Vaccines  Your health care provider may recommend certain vaccines, such as:  Influenza  vaccine. This is recommended every year.  Tetanus, diphtheria, and acellular pertussis (Tdap, Td) vaccine. You may need a Td booster every 10 years.  Zoster vaccine. You may need this after age 5.  Pneumococcal 13-valent conjugate (PCV13) vaccine. One dose is recommended after age 32.  Pneumococcal polysaccharide (PPSV23) vaccine. One dose is recommended after age 19. Talk to your health care provider about which screenings and vaccines you need and how often you need them. This information is not intended to replace advice given to you by your health care provider. Make sure you discuss any questions you have with your health care provider. Document Released: 01/11/2016 Document Revised: 09/03/2016 Document Reviewed: 10/16/2015 Elsevier Interactive Patient Education  2017 [place] Prevention in the Home Falls can cause injuries. They can happen to people of all ages. There are many things you can do to make your home safe and to help prevent falls. What can I do on the outside of my home?  Regularly fix the edges of walkways and driveways and fix any cracks.  Remove anything that might make you trip as you walk through a door, such as a raised step or threshold.  Trim any bushes or trees on the path to your home.  Use bright outdoor lighting.  Clear any walking paths of anything that might make someone trip, such as rocks or tools.  Regularly check to see if handrails are loose or broken. Make sure that both sides of any steps have handrails.  Any raised decks and porches should have guardrails on the edges.  Have any leaves, snow, or ice cleared regularly.  Use sand or salt on walking paths during winter.  Clean up any spills in your garage right away. This includes oil or grease spills. What can I do in the bathroom?  Use night lights.  Install grab bars by the toilet and in the tub and shower. Do not use towel bars as grab bars.  Use non-skid mats or decals  in the tub or shower.  If you need to sit down in the shower, use a plastic, non-slip stool.  Keep the floor dry. Clean up any water that spills on the floor as soon as it happens.  Remove soap buildup in the tub or shower regularly.  Attach bath mats securely with double-sided non-slip rug tape.  Do not have throw rugs and other things on the floor that can make you trip. What can I do in the bedroom?  Use night lights.  Make sure that you have a light by your bed that is easy to reach.  Do not use any sheets or blankets that are too big for your bed. They should not hang down onto the floor.  Have a firm chair that has side arms. You can use this for support while you get dressed.  Do not have throw rugs and other things on the floor that can make you trip. What can I do in the kitchen?  Clean up any spills right away.  Avoid walking on wet floors.  Keep items that you use a lot in easy-to-reach places.  If you need to reach something above you, use a strong step stool that has a grab bar.  Keep electrical cords  out of the way.  Do not use floor polish or wax that makes floors slippery. If you must use wax, use non-skid floor wax.  Do not have throw rugs and other things on the floor that can make you trip. What can I do with my stairs?  Do not leave any items on the stairs.  Make sure that there are handrails on both sides of the stairs and use them. Fix handrails that are broken or loose. Make sure that handrails are as long as the stairways.  Check any carpeting to make sure that it is firmly attached to the stairs. Fix any carpet that is loose or worn.  Avoid having throw rugs at the top or bottom of the stairs. If you do have throw rugs, attach them to the floor with carpet tape.  Make sure that you have a light switch at the top of the stairs and the bottom of the stairs. If you do not have them, ask someone to add them for you. What else can I do to help  prevent falls?  Wear shoes that:  Do not have high heels.  Have rubber bottoms.  Are comfortable and fit you well.  Are closed at the toe. Do not wear sandals.  If you use a stepladder:  Make sure that it is fully opened. Do not climb a closed stepladder.  Make sure that both sides of the stepladder are locked into place.  Ask someone to hold it for you, if possible.  Clearly mark and make sure that you can see:  Any grab bars or handrails.  First and last steps.  Where the edge of each step is.  Use tools that help you move around (mobility aids) if they are needed. These include:  Canes.  Walkers.  Scooters.  Crutches.  Turn on the lights when you go into a dark area. Replace any light bulbs as soon as they burn out.  Set up your furniture so you have a clear path. Avoid moving your furniture around.  If any of your floors are uneven, fix them.  If there are any pets around you, be aware of where they are.  Review your medicines with your doctor. Some medicines can make you feel dizzy. This can increase your chance of falling. Ask your doctor what other things that you can do to help prevent falls. This information is not intended to replace advice given to you by your health care provider. Make sure you discuss any questions you have with your health care provider. Document Released: 10/11/2009 Document Revised: 05/22/2016 Document Reviewed: 01/19/2015 Elsevier Interactive Patient Education  2017 [employer].

## 2019-03-25 ENCOUNTER — Encounter: Payer: Medicare Other | Admitting: Family Medicine

## 2019-03-25 ENCOUNTER — Ambulatory Visit: Payer: Medicare Other

## 2019-04-26 ENCOUNTER — Encounter: Payer: Medicare Other | Admitting: Family Medicine

## 2019-05-27 DIAGNOSIS — S0502XA Injury of conjunctiva and corneal abrasion without foreign body, left eye, initial encounter: Secondary | ICD-10-CM | POA: Diagnosis not present

## 2019-06-18 ENCOUNTER — Other Ambulatory Visit: Payer: Self-pay | Admitting: Family Medicine

## 2019-06-18 DIAGNOSIS — E039 Hypothyroidism, unspecified: Secondary | ICD-10-CM

## 2019-06-20 DIAGNOSIS — L720 Epidermal cyst: Secondary | ICD-10-CM | POA: Diagnosis not present

## 2019-06-20 NOTE — Telephone Encounter (Signed)
Please schedule follow-up within the month

## 2019-06-21 NOTE — Telephone Encounter (Signed)
Lvm to schedule appt in a month for follow up

## 2019-06-27 DIAGNOSIS — L720 Epidermal cyst: Secondary | ICD-10-CM | POA: Diagnosis not present

## 2019-07-04 ENCOUNTER — Ambulatory Visit (INDEPENDENT_AMBULATORY_CARE_PROVIDER_SITE_OTHER): Payer: Medicare Other | Admitting: Nurse Practitioner

## 2019-07-04 ENCOUNTER — Encounter: Payer: Self-pay | Admitting: Nurse Practitioner

## 2019-07-04 ENCOUNTER — Other Ambulatory Visit: Payer: Self-pay

## 2019-07-04 VITALS — BP 120/74 | HR 87 | Temp 97.3°F | Resp 14 | Ht 64.0 in | Wt 166.5 lb

## 2019-07-04 DIAGNOSIS — M8580 Other specified disorders of bone density and structure, unspecified site: Secondary | ICD-10-CM | POA: Diagnosis not present

## 2019-07-04 DIAGNOSIS — E785 Hyperlipidemia, unspecified: Secondary | ICD-10-CM

## 2019-07-04 DIAGNOSIS — E039 Hypothyroidism, unspecified: Secondary | ICD-10-CM | POA: Diagnosis not present

## 2019-07-04 DIAGNOSIS — Z5181 Encounter for therapeutic drug level monitoring: Secondary | ICD-10-CM | POA: Diagnosis not present

## 2019-07-04 LAB — COMPLETE METABOLIC PANEL WITH GFR
AG Ratio: 2 (calc) (ref 1.0–2.5)
ALT: 23 U/L (ref 6–29)
AST: 19 U/L (ref 10–35)
Albumin: 4.5 g/dL (ref 3.6–5.1)
Alkaline phosphatase (APISO): 99 U/L (ref 37–153)
BUN: 11 mg/dL (ref 7–25)
CO2: 26 mmol/L (ref 20–32)
Calcium: 9.1 mg/dL (ref 8.6–10.4)
Chloride: 108 mmol/L (ref 98–110)
Creat: 0.66 mg/dL (ref 0.50–0.99)
GFR, Est African American: 106 mL/min/{1.73_m2} (ref >=60)
GFR, Est Non African American: 91 mL/min/{1.73_m2} (ref >=60)
Globulin: 2.2 g/dL (calc) (ref 1.9–3.7)
Glucose, Bld: 98 mg/dL (ref 65–99)
Potassium: 4.4 mmol/L (ref 3.5–5.3)
Sodium: 142 mmol/L (ref 135–146)
Total Bilirubin: 0.4 mg/dL (ref 0.2–1.2)
Total Protein: 6.7 g/dL (ref 6.1–8.1)

## 2019-07-04 LAB — LIPID PANEL
Cholesterol: 204 mg/dL — ABNORMAL HIGH (ref <200)
HDL: 77 mg/dL (ref >=50)
LDL Cholesterol (Calc): 108 mg/dL (calc) — ABNORMAL HIGH
Non-HDL Cholesterol (Calc): 127 mg/dL (calc) (ref <130)
Total CHOL/HDL Ratio: 2.6 (calc) (ref <5.0)
Triglycerides: 95 mg/dL (ref <150)

## 2019-07-04 LAB — TSH: TSH: 2.2 mIU/L (ref 0.40–4.50)

## 2019-07-04 NOTE — Progress Notes (Signed)
Name: Lynn Sandoval   MRN: [id number]    DOB: Jan 11, 1951   Date:07/04/2019       Progress Note  Subjective  Chief Complaint  Chief Complaint  Patient presents with  . Follow-up    HPI  Hypothyroidism Patient rx levothryoxine 75 mcg daily. Has been on this dose for severalyears. Patient denies fatigue/palpitations, insomnia, constipation/diarrhea, hot/cold intolerances,   Lab Results  Component Value Date   TSH 1.80 05/21/2018   Hyperlipidemia Patient rx fish oils and atorvastatin 20mg  daily Takes medications as prescribed with no missed doses a month.  Diet: fried foods- once a week. Vegetables- a few servings a day.  Denies myalgias Lab Results  Component Value Date   CHOL 184 05/21/2018   HDL 74 05/21/2018   LDLCALC 94 05/21/2018   TRIG 71 05/21/2018   CHOLHDL 2.5 05/21/2018   Osteopenia Has been taking vitamin D and calcium supplementation. She walks at least 4 days a week for 2-3 miles.   Sees dermatologist every 6 months.   PHQ2/9: Depression screen [facility] 2/9 07/04/2019 03/24/2019 03/19/2018 09/17/2017 02/13/2017  Decreased Interest 0 0 0 0 0  Down, Depressed, Hopeless 0 0 0 0 0  PHQ - 2 Score 0 0 0 0 0  Altered sleeping 0 0 - - -  Tired, decreased energy 0 0 - - -  Change in appetite 0 0 - - -  Feeling bad or failure about yourself  0 0 - - -  Trouble concentrating 0 0 - - -  Moving slowly or fidgety/restless 0 0 - - -  Suicidal thoughts 0 0 - - -  PHQ-9 Score 0 0 - - -  Difficult doing work/chores Not difficult at all Not difficult at all - - -     PHQ reviewed. Negative  Patient Active Problem List   Diagnosis Date Noted  . Seborrheic keratosis 03/19/2018  . Melanoma in situ ([place]) 02/01/2018  . Osteopenia 02/25/2017  . Left atrial enlargement 02/13/2017  . H/O dysplastic nevus 02/13/2017  . Hyperlipidemia LDL goal <100 07/29/2015  . Hypothyroidism, adult 07/29/2015  . Adenomatous colon polyp 07/29/2015    Past Medical History:  Diagnosis Date  .  Appendicitis, acute 03/13/2018  . Arthritis    fingers  . Cancer ([place])    melanoma  . Colon polyps   . Family history of adverse reaction to anesthesia    son - slow to wake  . Gallstone 09/24/2017   [place] Sept 2018 1.8 cm  . H/O dysplastic nevus 02/13/2017   Followed by Dr. Brendolyn Patty  . Heart murmur    GRADE 1 DIASTOLIC MURMUR PER ECHO IN MARCH 2018  . Hyperlipidemia   . Hypothyroidism   . Motion sickness    boats  . Osteopenia 02/25/2017   DEXA Feb 2018  . Personal history of colonic polyps   . Thyroid disease     Past Surgical History:  Procedure Laterality Date  . APPENDECTOMY  March 2019  . [place]  . CHOLECYSTECTOMY N/A 10/13/2017   Procedure: LAPAROSCOPIC CHOLECYSTECTOMY;  Surgeon: Jules Husbands, MD;  Location: [facility] ORS;  Service: General;  Laterality: N/A;  . COLONOSCOPY WITH PROPOFOL N/A 02/04/2016   Procedure: COLONOSCOPY WITH PROPOFOL;  Surgeon: Lucilla Lame, MD;  Location: [place];  Service: Endoscopy;  Laterality: N/A;  . EYE SURGERY  1955  . LAPAROSCOPIC APPENDECTOMY N/A 03/13/2018   Procedure: APPENDECTOMY LAPAROSCOPIC;  Surgeon: Robert Bellow, MD;  Location: [facility]  ORS;  Service: General;  Laterality: N/A;  . POLYPECTOMY  02/04/2016   Procedure: POLYPECTOMY;  Surgeon: Lucilla Lame, MD;  Location: [place];  Service: Endoscopy;;  . SKIN SURGERY Right 01/2018   melanoma removalm right arm  . TONSILLECTOMY  1960  . TUBAL LIGATION  1985    Social History   Tobacco Use  . Smoking status: Never Smoker  . Smokeless tobacco: Never Used  Substance Use Topics  . Alcohol use: Yes    Alcohol/week: 3.0 standard drinks    Types: 3 Glasses of wine per week    Comment: ocassional glass of wine     Current Outpatient Medications:  .  acetaminophen (TYLENOL) 325 MG tablet, Take 650 mg by mouth every 6 (six) hours as needed for mild pain or headache., Disp: , Rfl:  .  ASPIRIN 81 PO, Take 1 tablet by mouth daily., Disp:  , Rfl:  .  atorvastatin (LIPITOR) 20 MG tablet, TAKE 1 TABLET BY MOUTH AT  BEDTIME, Disp: 90 tablet, Rfl: 1 .  Calcium Carbonate-Vitamin D (CALCIUM 500 + D PO), Take 1 tablet by mouth daily., Disp: , Rfl:  .  levothyroxine (SYNTHROID) 75 MCG tablet, TAKE 1 TABLET BY MOUTH  DAILY BEFORE BREAKFAST, Disp: 90 tablet, Rfl: 0 .  Omega-3 Fatty Acids (FISH OIL) 1000 MG CAPS, Take 1,000 mg by mouth daily., Disp: , Rfl:  .  Propylene Glycol (SYSTANE BALANCE OP), Apply 1 drop to eye daily., Disp: , Rfl:   No Known Allergies  Review of Systems  Constitutional: Negative for chills, fever and malaise/fatigue.  HENT: Negative for congestion, sinus pain and sore throat.   Eyes: Negative for blurred vision.  Respiratory: Negative for cough and shortness of breath.   Cardiovascular: Negative for chest pain, palpitations and leg swelling.  Gastrointestinal: Negative for abdominal pain, blood in stool, constipation, diarrhea and nausea.  Genitourinary: Negative for dysuria.  Musculoskeletal: Negative for falls and joint pain.  Skin: Negative for rash.  Neurological: Negative for dizziness, tingling and headaches.  Endo/Heme/Allergies: Negative for polydipsia.  Psychiatric/Behavioral: The patient is not nervous/anxious and does not have insomnia.      No other specific complaints in a complete review of systems (except as listed in HPI above).  Objective  Vitals:   07/04/19 0916  BP: 120/74  Pulse: 87  Resp: 14  Temp: (!) 97.3 F (36.3 C)  TempSrc: Temporal  SpO2: 96%  Weight: 166 lb 8 oz (75.5 kg)  Height: 5\' 4"  (1.626 m)    Body mass index is 28.58 kg/m.  Nursing Note and Vital Signs reviewed.  Physical Exam     No results found for this or any previous visit (from the past 48 hour(s)).  Assessment & Plan  1. Hypothyroidism, adult Continue meds  - TSH  2. Osteopenia, unspecified location Continue supplementation and weight bearing exercise, schedule DEXA  3. Hyperlipidemia  LDL goal <100 Continue meds - Lipid Profile  4. Medication monitoring encounter - COMPLETE METABOLIC PANEL WITH GFR

## 2019-07-04 NOTE — Patient Instructions (Addendum)
If you have not heard anything from my staff in a week about any orders/referrals/studies from today, please contact us here to follow-up [phone]  Please do call to schedule your bone density study; the number to schedule one at either [facility] or [facility] is [phone]

## 2019-07-05 ENCOUNTER — Other Ambulatory Visit: Payer: Self-pay | Admitting: Nurse Practitioner

## 2019-07-05 ENCOUNTER — Encounter: Payer: Self-pay | Admitting: Family Medicine

## 2019-07-05 DIAGNOSIS — E785 Hyperlipidemia, unspecified: Secondary | ICD-10-CM

## 2019-07-05 DIAGNOSIS — M8580 Other specified disorders of bone density and structure, unspecified site: Secondary | ICD-10-CM

## 2019-07-05 DIAGNOSIS — E039 Hypothyroidism, unspecified: Secondary | ICD-10-CM

## 2019-07-05 MED ORDER — ATORVASTATIN CALCIUM 20 MG PO TABS: 20.0000 mg | ORAL_TABLET | Freq: Every day | ORAL | 3 refills | Status: DC

## 2019-07-05 MED ORDER — LEVOTHYROXINE SODIUM 75 MCG PO TABS: ORAL_TABLET | ORAL | 3 refills | Status: DC

## 2019-07-12 ENCOUNTER — Other Ambulatory Visit: Payer: Medicare Other

## 2019-07-12 ENCOUNTER — Telehealth: Payer: Self-pay | Admitting: General Practice

## 2019-07-12 ENCOUNTER — Telehealth: Payer: Medicare Other | Admitting: Family

## 2019-07-12 DIAGNOSIS — Z20822 Contact with and (suspected) exposure to covid-19: Secondary | ICD-10-CM

## 2019-07-12 DIAGNOSIS — R6889 Other general symptoms and signs: Secondary | ICD-10-CM | POA: Diagnosis not present

## 2019-07-12 NOTE — Progress Notes (Signed)
E-Visit for Corona Virus Screening   Your current symptoms could be consistent with the coronavirus.  Call your health care provider or local health department to request and arrange formal testing. Many health care providers can now test patients at their office but not all are.  Please quarantine yourself while awaiting your test results.  [place] [phone], [place], [place] [phone] or visit [email or web address]    I will have someone to call you and schedule and appointment for testing.    COVID-19 is a respiratory illness with symptoms that are similar to the flu. Symptoms are typically mild to moderate, but there have been cases of severe illness and death due to the virus. The following symptoms may appear 2-14 days after exposure: . Fever . Cough . Shortness of breath or difficulty breathing . Chills . Repeated shaking with chills . Muscle pain . Headache . Sore throat . New loss of taste or smell . Fatigue . Congestion or runny nose . Nausea or vomiting . Diarrhea  It is vitally important that if you feel that you have an infection such as this virus or any other virus that you stay home and away from places where you may spread it to others.  You should self-quarantine for 14 days if you have symptoms that could potentially be coronavirus or have been in close contact a with a person diagnosed with COVID-19 within the last 2 weeks. You should avoid contact with people age 67 and older.   You should wear a mask or cloth face covering over your nose and mouth if you must be around other people or animals, including pets (even at home). Try to stay at least 6 feet away from other people. This will protect the people around you.   You may also take acetaminophen (Tylenol) as needed for fever.   Reduce your risk of any infection  by using the same precautions used for avoiding the common cold or flu:  Marland Kitchen Wash your hands often with soap and warm water for at least 20 seconds.  If soap and water are not readily available, use an alcohol-based hand sanitizer with at least 60% alcohol.  . If coughing or sneezing, cover your mouth and nose by coughing or sneezing into the elbow areas of your shirt or coat, into a tissue or into your sleeve (not your hands). . Avoid shaking hands with others and consider head nods or verbal greetings only. . Avoid touching your eyes, nose, or mouth with unwashed hands.  . Avoid close contact with people who are sick. . Avoid places or events with large numbers of people in one location, like concerts or sporting events. . Carefully consider travel plans you have or are making. . If you are planning any travel outside or inside the [place], visit the CDC's Travelers' Health webpage for the latest health notices. . If you have some symptoms but not all symptoms, continue to monitor at home and seek medical attention if your symptoms worsen. . If you are having a medical emergency, call 911.  HOME CARE . Only take medications as instructed by your medical team. . Drink plenty of fluids and get plenty of rest. . A steam or ultrasonic humidifier can help if you have congestion.   GET HELP RIGHT AWAY IF YOU HAVE EMERGENCY WARNING SIGNS** FOR COVID-19. If you or someone is showing any of these signs seek emergency medical care immediately. Call 911 or  proceed to your closest emergency facility if: . You develop worsening high fever. . Trouble breathing . Bluish lips or face . Persistent pain or pressure in the chest . New confusion . Inability to wake or stay awake . You cough up blood. . Your symptoms become more severe  **This list is not all possible symptoms. Contact your medical provider for any symptoms that are sever or concerning to you.   MAKE SURE YOU   Understand these  instructions.  Will watch your condition.  Will get help right away if you are not doing well or get worse.  Your e-visit answers were reviewed by a board certified advanced clinical practitioner to complete your personal care plan.  Depending on the condition, your plan could have included both over the counter or prescription medications.  If there is a problem please reply once you have received a response from your provider.  Your safety is important to [place].  If you have drug allergies check your prescription carefully.    You can use MyChart to ask questions about today's visit, request a non-urgent call back, or ask for a work or school excuse for 24 hours related to this e-Visit. If it has been greater than 24 hours you will need to follow up with your provider, or enter a new e-Visit to address those concerns. You will get an e-mail in the next two days asking about your experience.  I hope that your e-visit has been valuable and will speed your recovery. Thank you for using e-visits.  Greater than 5 minutes, yet less than 10 minutes of time have been spent researching, coordinating, and implementing care for this patient today.  Thank you for the details you included in the comment boxes. Those details are very helpful in determining the best course of treatment for you and help [place] to provide the best care.

## 2019-07-12 NOTE — Telephone Encounter (Signed)
Pt has been scheduled for Covid testing.  Pt was referred via e-visit by provider Kennyth Arnold, FNP Pt is scheduled at [facility] testing site location.

## 2019-07-12 NOTE — Addendum Note (Signed)
Addended by: Dimple Nanas on: 07/12/2019 11:37 AM   Modules accepted: Orders

## 2019-07-16 LAB — NOVEL CORONAVIRUS, NAA: SARS-CoV-2, NAA: NOT DETECTED

## 2019-08-12 ENCOUNTER — Ambulatory Visit
Admission: RE | Admit: 2019-08-12 | Discharge: 2019-08-12 | Disposition: A | Discharge status: Home / Self Care | Payer: Medicare Other | Source: Ambulatory Visit | Attending: Nurse Practitioner | Admitting: Nurse Practitioner

## 2019-08-12 DIAGNOSIS — M8589 Other specified disorders of bone density and structure, multiple sites: Secondary | ICD-10-CM | POA: Diagnosis not present

## 2019-08-12 DIAGNOSIS — M8580 Other specified disorders of bone density and structure, unspecified site: Secondary | ICD-10-CM | POA: Diagnosis present

## 2019-08-12 DIAGNOSIS — Z780 Asymptomatic menopausal state: Secondary | ICD-10-CM | POA: Diagnosis not present

## 2019-09-06 DIAGNOSIS — Z1283 Encounter for screening for malignant neoplasm of skin: Secondary | ICD-10-CM | POA: Diagnosis not present

## 2019-09-06 DIAGNOSIS — Z86007 Personal history of in-situ neoplasm of skin: Secondary | ICD-10-CM | POA: Diagnosis not present

## 2019-09-06 DIAGNOSIS — B078 Other viral warts: Secondary | ICD-10-CM | POA: Diagnosis not present

## 2019-09-06 DIAGNOSIS — L814 Other melanin hyperpigmentation: Secondary | ICD-10-CM | POA: Diagnosis not present

## 2019-10-14 ENCOUNTER — Ambulatory Visit (INDEPENDENT_AMBULATORY_CARE_PROVIDER_SITE_OTHER): Payer: Medicare Other

## 2019-10-14 ENCOUNTER — Other Ambulatory Visit: Payer: Self-pay

## 2019-10-14 DIAGNOSIS — Z23 Encounter for immunization: Secondary | ICD-10-CM

## 2020-01-05 ENCOUNTER — Ambulatory Visit: Payer: Medicare Other | Admitting: Family Medicine

## 2020-03-05 DIAGNOSIS — D2230 Melanocytic nevi of unspecified part of face: Secondary | ICD-10-CM | POA: Diagnosis not present

## 2020-03-05 DIAGNOSIS — L814 Other melanin hyperpigmentation: Secondary | ICD-10-CM | POA: Diagnosis not present

## 2020-03-05 DIAGNOSIS — D2261 Melanocytic nevi of right upper limb, including shoulder: Secondary | ICD-10-CM | POA: Diagnosis not present

## 2020-03-05 DIAGNOSIS — B079 Viral wart, unspecified: Secondary | ICD-10-CM | POA: Diagnosis not present

## 2020-03-05 DIAGNOSIS — Z1283 Encounter for screening for malignant neoplasm of skin: Secondary | ICD-10-CM | POA: Diagnosis not present

## 2020-03-05 DIAGNOSIS — L578 Other skin changes due to chronic exposure to nonionizing radiation: Secondary | ICD-10-CM | POA: Diagnosis not present

## 2020-03-13 ENCOUNTER — Other Ambulatory Visit: Payer: Self-pay | Admitting: Family Medicine

## 2020-03-13 DIAGNOSIS — Z1231 Encounter for screening mammogram for malignant neoplasm of breast: Secondary | ICD-10-CM

## 2020-03-27 ENCOUNTER — Ambulatory Visit: Payer: Medicare Other

## 2020-04-24 ENCOUNTER — Ambulatory Visit (INDEPENDENT_AMBULATORY_CARE_PROVIDER_SITE_OTHER): Payer: Medicare Other

## 2020-04-24 ENCOUNTER — Ambulatory Visit
Admission: RE | Admit: 2020-04-24 | Discharge: 2020-04-24 | Disposition: A | Discharge status: Home / Self Care | Payer: Medicare Other | Source: Ambulatory Visit | Attending: Family Medicine | Admitting: Family Medicine

## 2020-04-24 ENCOUNTER — Other Ambulatory Visit: Payer: Self-pay

## 2020-04-24 VITALS — BP 134/88 | HR 72 | Temp 97.3°F | Resp 16 | Ht 64.0 in | Wt 161.4 lb

## 2020-04-24 DIAGNOSIS — Z0000 Encounter for general adult medical examination without abnormal findings: Secondary | ICD-10-CM

## 2020-04-24 DIAGNOSIS — Z1231 Encounter for screening mammogram for malignant neoplasm of breast: Secondary | ICD-10-CM | POA: Insufficient documentation

## 2020-04-24 NOTE — Patient Instructions (Signed)
[name] , Thank you for taking time to come for your Medicare Wellness Visit. I appreciate your ongoing commitment to your health goals. Please review the following plan we discussed and let me know if I can assist you in the future.   Screening recommendations/referrals: Colonoscopy: done 02/04/16. Repeat in 2022. Mammogram: done 04/24/20 Bone Density: done 08/12/19 Recommended yearly ophthalmology/optometry visit for glaucoma screening and checkup Recommended yearly dental visit for hygiene and checkup  Vaccinations: Influenza vaccine: done 10/14/19 Pneumococcal vaccine: done 03/19/18 Tdap vaccine: done 09/17/17 Shingles vaccine: Shingrix discussed. Please contact your pharmacy for coverage information.  Covid-19: done 02/17/20 & 03/09/20  Advanced directives: Please bring a copy of your health care power of attorney and living will to the office at your convenience once you have completed those documents.   Conditions/risks identified: Keep up the great work!  Next appointment: Please follow up in one year for your Medicare Annual Wellness visit.     Preventive Care 45 Years and Older, Female Preventive care refers to lifestyle choices and visits with your health care provider that can promote health and wellness. What does preventive care include?  A yearly physical exam. This is also called an annual well check.  Dental exams once or twice a year.  Routine eye exams. Ask your health care provider how often you should have your eyes checked.  Personal lifestyle choices, including:  Daily care of your teeth and gums.  Regular physical activity.  Eating a healthy diet.  Avoiding tobacco and drug use.  Limiting alcohol use.  Practicing safe sex.  Taking low-dose aspirin every day.  Taking vitamin and mineral supplements as recommended by your health care provider. What happens during an annual well check? The services and screenings done by your health care provider  during your annual well check will depend on your age, overall health, lifestyle risk factors, and family history of disease. Counseling  Your health care provider may ask you questions about your:  Alcohol use.  Tobacco use.  Drug use.  Emotional well-being.  Home and relationship well-being.  Sexual activity.  Eating habits.  History of falls.  Memory and ability to understand (cognition).  Work and work Statistician.  Reproductive health. Screening  You may have the following tests or measurements:  Height, weight, and BMI.  Blood pressure.  Lipid and cholesterol levels. These may be checked every 5 years, or more frequently if you are over 20 years old.  Skin check.  Lung cancer screening. You may have this screening every year starting at age 47 if you have a 30-pack-year history of smoking and currently smoke or have quit within the past 15 years.  Fecal occult blood test (FOBT) of the stool. You may have this test every year starting at age 35.  Flexible sigmoidoscopy or colonoscopy. You may have a sigmoidoscopy every 5 years or a colonoscopy every 10 years starting at age 85.  Hepatitis C blood test.  Hepatitis B blood test.  Sexually transmitted disease (STD) testing.  Diabetes screening. This is done by checking your blood sugar (glucose) after you have not eaten for a while (fasting). You may have this done every 1-3 years.  Bone density scan. This is done to screen for osteoporosis. You may have this done starting at age 36.  Mammogram. This may be done every 1-2 years. Talk to your health care provider about how often you should have regular mammograms. Talk with your health care provider about your test results, treatment  options, and if necessary, the need for more tests. Vaccines  Your health care provider may recommend certain vaccines, such as:  Influenza vaccine. This is recommended every year.  Tetanus, diphtheria, and acellular pertussis  (Tdap, Td) vaccine. You may need a Td booster every 10 years.  Zoster vaccine. You may need this after age 54.  Pneumococcal 13-valent conjugate (PCV13) vaccine. One dose is recommended after age 74.  Pneumococcal polysaccharide (PPSV23) vaccine. One dose is recommended after age 1. Talk to your health care provider about which screenings and vaccines you need and how often you need them. This information is not intended to replace advice given to you by your health care provider. Make sure you discuss any questions you have with your health care provider. Document Released: 01/11/2016 Document Revised: 09/03/2016 Document Reviewed: 10/16/2015 Elsevier Interactive Patient Education  2017 [place] Prevention in the Home Falls can cause injuries. They can happen to people of all ages. There are many things you can do to make your home safe and to help prevent falls. What can I do on the outside of my home?  Regularly fix the edges of walkways and driveways and fix any cracks.  Remove anything that might make you trip as you walk through a door, such as a raised step or threshold.  Trim any bushes or trees on the path to your home.  Use bright outdoor lighting.  Clear any walking paths of anything that might make someone trip, such as rocks or tools.  Regularly check to see if handrails are loose or broken. Make sure that both sides of any steps have handrails.  Any raised decks and porches should have guardrails on the edges.  Have any leaves, snow, or ice cleared regularly.  Use sand or salt on walking paths during winter.  Clean up any spills in your garage right away. This includes oil or grease spills. What can I do in the bathroom?  Use night lights.  Install grab bars by the toilet and in the tub and shower. Do not use towel bars as grab bars.  Use non-skid mats or decals in the tub or shower.  If you need to sit down in the shower, use a plastic, non-slip  stool.  Keep the floor dry. Clean up any water that spills on the floor as soon as it happens.  Remove soap buildup in the tub or shower regularly.  Attach bath mats securely with double-sided non-slip rug tape.  Do not have throw rugs and other things on the floor that can make you trip. What can I do in the bedroom?  Use night lights.  Make sure that you have a light by your bed that is easy to reach.  Do not use any sheets or blankets that are too big for your bed. They should not hang down onto the floor.  Have a firm chair that has side arms. You can use this for support while you get dressed.  Do not have throw rugs and other things on the floor that can make you trip. What can I do in the kitchen?  Clean up any spills right away.  Avoid walking on wet floors.  Keep items that you use a lot in easy-to-reach places.  If you need to reach something above you, use a strong step stool that has a grab bar.  Keep electrical cords out of the way.  Do not use floor polish or wax that makes floors slippery.  If you must use wax, use non-skid floor wax.  Do not have throw rugs and other things on the floor that can make you trip. What can I do with my stairs?  Do not leave any items on the stairs.  Make sure that there are handrails on both sides of the stairs and use them. Fix handrails that are broken or loose. Make sure that handrails are as long as the stairways.  Check any carpeting to make sure that it is firmly attached to the stairs. Fix any carpet that is loose or worn.  Avoid having throw rugs at the top or bottom of the stairs. If you do have throw rugs, attach them to the floor with carpet tape.  Make sure that you have a light switch at the top of the stairs and the bottom of the stairs. If you do not have them, ask someone to add them for you. What else can I do to help prevent falls?  Wear shoes that:  Do not have high heels.  Have rubber bottoms.  Are  comfortable and fit you well.  Are closed at the toe. Do not wear sandals.  If you use a stepladder:  Make sure that it is fully opened. Do not climb a closed stepladder.  Make sure that both sides of the stepladder are locked into place.  Ask someone to hold it for you, if possible.  Clearly mark and make sure that you can see:  Any grab bars or handrails.  First and last steps.  Where the edge of each step is.  Use tools that help you move around (mobility aids) if they are needed. These include:  Canes.  Walkers.  Scooters.  Crutches.  Turn on the lights when you go into a dark area. Replace any light bulbs as soon as they burn out.  Set up your furniture so you have a clear path. Avoid moving your furniture around.  If any of your floors are uneven, fix them.  If there are any pets around you, be aware of where they are.  Review your medicines with your doctor. Some medicines can make you feel dizzy. This can increase your chance of falling. Ask your doctor what other things that you can do to help prevent falls. This information is not intended to replace advice given to you by your health care provider. Make sure you discuss any questions you have with your health care provider. Document Released: 10/11/2009 Document Revised: 05/22/2016 Document Reviewed: 01/19/2015 Elsevier Interactive Patient Education  2017 [employer].

## 2020-04-24 NOTE — Progress Notes (Signed)
Subjective:   Lynn Sandoval is a 69 y.o. female who presents for Medicare Annual (Subsequent) preventive examination.  Review of Systems:   Cardiac Risk Factors include: advanced age (>77men, >69 women);dyslipidemia;hypertension     Objective:     Vitals: BP 134/88 (BP Location: Right Arm, Patient Position: Sitting, Cuff Size: Normal)   Pulse 72   Temp (!) 97.3 F (36.3 C) (Temporal)   Resp 16   Ht 5\' 4"  (1.626 m)   Wt 161 lb 6.4 oz (73.2 kg)   SpO2 99%   BMI 27.70 kg/m   Body mass index is 27.7 kg/m.  Advanced Directives 04/24/2020 03/24/2019 03/13/2018 03/13/2018 03/12/2018 10/13/2017 10/06/2017  Does Patient Have a Medical Advance Directive? No No No No No No No  Would patient like information on creating a medical advance directive? No - Patient declined Yes (MAU/Ambulatory/Procedural Areas - Information given) - No - Patient declined No - Patient declined Yes (MAU/Ambulatory/Procedural Areas - Information given) -    Tobacco Social History   Tobacco Use  Smoking Status Never Smoker  Smokeless Tobacco Never Used     Counseling given: Not Answered   Clinical Intake:  Pre-visit preparation completed: Yes  Pain : No/denies pain     BMI - recorded: 27.7 Nutritional Status: BMI 25 -29 Overweight Nutritional Risks: None Diabetes: No  How often do you need to have someone help you when you read instructions, pamphlets, or other written materials from your doctor or pharmacy?: 1 - Never  Interpreter Needed?: No  Information entered by :: Clemetine Marker LPN  Past Medical History:  Diagnosis Date  . Appendicitis, acute 03/13/2018  . Arthritis    fingers  . Cancer ([place])    melanoma  . Colon polyps   . Family history of adverse reaction to anesthesia    son - slow to wake  . Gallstone 09/24/2017   [place] Sept 2018 1.8 cm  . H/O dysplastic nevus 02/13/2017   Followed by Dr. Brendolyn Patty  . Heart murmur    GRADE 1 DIASTOLIC MURMUR PER ECHO IN MARCH 2018  .  Hyperlipidemia   . Hypothyroidism   . Motion sickness    boats  . Osteopenia 02/25/2017   DEXA Feb 2018  . Personal history of colonic polyps   . Thyroid disease    Past Surgical History:  Procedure Laterality Date  . APPENDECTOMY  March 2019  . [place]  . CHOLECYSTECTOMY N/A 10/13/2017   Procedure: LAPAROSCOPIC CHOLECYSTECTOMY;  Surgeon: Jules Husbands, MD;  Location: [facility];  Service: General;  Laterality: N/A;  . COLONOSCOPY WITH PROPOFOL N/A 02/04/2016   Procedure: COLONOSCOPY WITH PROPOFOL;  Surgeon: Lucilla Lame, MD;  Location: [place];  Service: Endoscopy;  Laterality: N/A;  . EYE SURGERY  1955  . LAPAROSCOPIC APPENDECTOMY N/A 03/13/2018   Procedure: APPENDECTOMY LAPAROSCOPIC;  Surgeon: Robert Bellow, MD;  Location: [facility];  Service: General;  Laterality: N/A;  . POLYPECTOMY  02/04/2016   Procedure: POLYPECTOMY;  Surgeon: Lucilla Lame, MD;  Location: [place];  Service: Endoscopy;;  . SKIN SURGERY Right 01/2018   melanoma removalm right arm  . TONSILLECTOMY  1960  . TUBAL LIGATION  1985   Family History  Problem Relation Age of Onset  . Depression Mother   . Hearing loss Mother   . Hyperlipidemia Mother   . Hypertension Mother   . Glaucoma Mother   . Cancer Mother  melanoma  . COPD Mother   . Heart disease Father   . Heart attack Father   . Hearing loss Father   . Diabetes Maternal Grandmother   . Heart attack Maternal Grandmother   . Cancer Paternal Grandmother        colon  . Breast cancer Neg Hx    Social History   Socioeconomic History  . Marital status: Married    Spouse name: Not on file  . Number of children: 4  . Years of education: Not on file  . Highest education level: Master's degree (e.g., MA, MS, MEng, MEd, MSW, MBA)  Occupational History  . Occupation: retired  Tobacco Use  . Smoking status: Never Smoker  . Smokeless tobacco: Never Used  Substance and Sexual Activity  . Alcohol  use: Yes    Alcohol/week: 3.0 standard drinks    Types: 3 Glasses of wine per week    Comment: ocassional glass of wine  . Drug use: No  . Sexual activity: Not Currently    Birth control/protection: None  Other Topics Concern  . Not on file  Social History Narrative  . Not on file   Social Determinants of Health   Financial Resource Strain: Low Risk   . Difficulty of Paying Living Expenses: Not hard at all  Food Insecurity: No Food Insecurity  . Worried About Charity fundraiser in the Last Year: Never true  . Ran Out of Food in the Last Year: Never true  Transportation Needs: No Transportation Needs  . Lack of Transportation (Medical): No  . Lack of Transportation (Non-Medical): No  Physical Activity: Sufficiently Active  . Days of Exercise per Week: 5 days  . Minutes of Exercise per Session: 60 min  Stress: No Stress Concern Present  . Feeling of Stress : Not at all  Social Connections: Slightly Isolated  . Frequency of Communication with Friends and Family: More than three times a week  . Frequency of Social Gatherings with Friends and Family: Three times a week  . Attends Religious Services: More than 4 times per year  . Active Member of Clubs or Organizations: No  . Attends Archivist Meetings: Never  . Marital Status: Married    Outpatient Encounter Medications as of 04/24/2020  Medication Sig  . acetaminophen (TYLENOL) 325 MG tablet Take 650 mg by mouth every 6 (six) hours as needed for mild pain or headache.  . ASPIRIN 81 PO Take 1 tablet by mouth daily.  Marland Kitchen atorvastatin (LIPITOR) 20 MG tablet Take 1 tablet (20 mg total) by mouth at bedtime.  . Calcium Carbonate-Vitamin D (CALCIUM 500 + D PO) Take 1 tablet by mouth daily.  Marland Kitchen levothyroxine (SYNTHROID) 75 MCG tablet TAKE 1 TABLET BY MOUTH  DAILY BEFORE BREAKFAST  . Omega-3 Fatty Acids (FISH OIL) 1000 MG CAPS Take 1,000 mg by mouth daily.  Marland Kitchen Propylene Glycol (SYSTANE BALANCE OP) Apply 1 drop to eye daily.    No facility-administered encounter medications on file as of 04/24/2020.    Activities of Daily Living In your present state of health, do you have any difficulty performing the following activities: 04/24/2020 07/04/2019  Hearing? N N  Comment declines hearing aid -  Vision? N N  Difficulty concentrating or making decisions? N N  Walking or climbing stairs? N N  Dressing or bathing? N N  Doing errands, shopping? N N  Preparing Food and eating ? N -  Using the Toilet? N -  In the past  six months, have you accidently leaked urine? N -  Do you have problems with loss of bowel control? N -  Managing your Medications? N -  Managing your Finances? N -  Housekeeping or managing your Housekeeping? N -  Some recent data might be hidden    Patient Care Team: Delsa Grana, PA-C as PCP - General (Family Medicine) Lucilla Lame, MD as Consulting Physician (Gastroenterology) Brendolyn Patty, MD (Dermatology) Jules Husbands, MD as Consulting Physician (General Surgery) Bary Castilla Forest Gleason, MD (General Surgery)    Assessment:   This is a routine wellness examination for [place].  Exercise Activities and Dietary recommendations Current Exercise Habits: Home exercise routine, Type of exercise: walking, Time (Minutes): 60, Frequency (Times/Week): 5, Weekly Exercise (Minutes/Week): 300, Intensity: Mild, Exercise limited by: None identified  Goals    . DIET - EAT MORE FRUITS AND VEGETABLES (pt-stated)       Fall Risk Fall Risk  04/24/2020 07/04/2019 03/24/2019 03/19/2018 09/17/2017  Falls in the past year? 0 0 0 No No  Number falls in past yr: 0 0 0 - -  Injury with Fall? 0 0 0 - -  Risk for fall due to : No Fall Risks - - - -  Follow up Falls prevention discussed - Falls prevention discussed - -   FALL RISK PREVENTION PERTAINING TO THE HOME:  Any stairs in or around the home? Yes  If so, do they handrails? Yes   Home free of loose throw rugs in walkways, pet beds, electrical cords, etc? Yes   Adequate lighting in your home to reduce risk of falls? Yes   ASSISTIVE DEVICES UTILIZED TO PREVENT FALLS:  Life alert? No  Use of a cane, walker or w/c? No  Grab bars in the bathroom? No  Shower chair or bench in shower? No  Elevated toilet seat or a handicapped toilet? No   DME ORDERS:  DME order needed?  No   TIMED UP AND GO:  Was the test performed? Yes .  Length of time to ambulate 10 feet: 5 sec.   GAIT:  Appearance of gait: Gait stead-fast and without the use of an assistive device.   Education: Fall risk prevention has been discussed.  Intervention(s) required? No    Depression Screen PHQ 2/9 Scores 04/24/2020 07/04/2019 03/24/2019 03/19/2018  PHQ - 2 Score 1 0 0 0  PHQ- 9 Score 1 0 0 -     Cognitive Function 6CIT deferred for 2021 AWV. Pt declined, no memory issues.      6CIT Screen 03/24/2019  What Year? 0 points  What month? 0 points  What time? 0 points  Count back from 20 0 points  Months in reverse 0 points  Repeat phrase 0 points  Total Score 0    Immunization History  Administered Date(s) Administered  . Fluad Quad(high Dose 65+) 10/14/2019  . Influenza, High Dose Seasonal PF 09/17/2017, 10/27/2018  . Influenza,inj,Quad PF,6+ Mos 01/21/2016  . PFIZER SARS-COV-2 Vaccination 02/17/2020, 03/09/2020  . Pneumococcal Conjugate-13 02/13/2017  . Pneumococcal Polysaccharide-23 03/19/2018  . Tdap 09/17/2017  . Zoster 08/26/2013    Qualifies for Shingles Vaccine? Yes  Zostavax completed 2014. Due for Shingrix. Education has been provided regarding the importance of this vaccine. Pt has been advised to call insurance company to determine out of pocket expense. Advised may also receive vaccine at local pharmacy or Health Dept. Verbalized acceptance and understanding.  Tdap: Up to date  Flu Vaccine: Up to date  Pneumococcal Vaccine: Up  to date   Screening Tests Health Maintenance  Topic Date Due  . MAMMOGRAM  02/18/2020  . INFLUENZA VACCINE   07/29/2020  . COLONOSCOPY  02/03/2021  . TETANUS/TDAP  09/18/2027  . DEXA SCAN  Completed  . COVID-19 Vaccine  Completed  . Hepatitis C Screening  Completed  . PNA vac Low Risk Adult  Completed    Cancer Screenings:  Colorectal Screening: Completed 02/04/16. Repeat every 5 years;   Mammogram: Completed 04/24/20. Repeat every year;   Bone Density: Completed 08/12/19. Results reflect  OSTEOPENIA. Repeat every 2 years.   Lung Cancer Screening: (Low Dose CT Chest recommended if Age 7-80 years, 30 pack-year currently smoking OR have quit w/in 15years.) does not qualify.   Additional Screening:  Hepatitis C Screening: does qualify; Completed 03/26/2017  Vision Screening: Recommended annual ophthalmology exams for early detection of glaucoma and other disorders of the eye. Is the patient up to date with their annual eye exam?  Yes  Who is the provider or what is the name of the office in which the pt attends annual eye exams? Dr. Marvel Plan  Dental Screening: Recommended annual dental exams for proper oral hygiene  Community Resource Referral:  CRR required this visit?  No      Plan:     I have personally reviewed and addressed the Medicare Annual Wellness questionnaire and have noted the following in the patient's chart:  A. Medical and social history B. Use of alcohol, tobacco or illicit drugs  C. Current medications and supplements D. Functional ability and status E.  Nutritional status F.  Physical activity G. Advance directives H. List of other physicians I.  Hospitalizations, surgeries, and ER visits in previous 12 months J.  [place] such as hearing and vision if needed, cognitive and depression L. Referrals and appointments   In addition, I have reviewed and discussed with patient certain preventive protocols, quality metrics, and best practice recommendations. A written personalized care plan for preventive services as well as general preventive health  recommendations were provided to patient.   Signed,  Clemetine Marker, LPN Nurse Health Advisor   Nurse Notes: none

## 2020-06-19 ENCOUNTER — Ambulatory Visit (INDEPENDENT_AMBULATORY_CARE_PROVIDER_SITE_OTHER): Payer: Medicare Other | Admitting: Family Medicine

## 2020-06-19 ENCOUNTER — Other Ambulatory Visit: Payer: Self-pay

## 2020-06-19 ENCOUNTER — Encounter: Payer: Self-pay | Admitting: Family Medicine

## 2020-06-19 VITALS — BP 130/84 | HR 75 | Temp 97.9°F | Resp 16 | Ht 64.0 in | Wt 163.8 lb

## 2020-06-19 DIAGNOSIS — Z0000 Encounter for general adult medical examination without abnormal findings: Secondary | ICD-10-CM

## 2020-06-19 DIAGNOSIS — Z86018 Personal history of other benign neoplasm: Secondary | ICD-10-CM

## 2020-06-19 DIAGNOSIS — M8580 Other specified disorders of bone density and structure, unspecified site: Secondary | ICD-10-CM | POA: Diagnosis not present

## 2020-06-19 DIAGNOSIS — E785 Hyperlipidemia, unspecified: Secondary | ICD-10-CM | POA: Diagnosis not present

## 2020-06-19 DIAGNOSIS — E039 Hypothyroidism, unspecified: Secondary | ICD-10-CM

## 2020-06-19 DIAGNOSIS — F32A Depression, unspecified: Secondary | ICD-10-CM

## 2020-06-19 DIAGNOSIS — F329 Major depressive disorder, single episode, unspecified: Secondary | ICD-10-CM

## 2020-06-19 DIAGNOSIS — F419 Anxiety disorder, unspecified: Secondary | ICD-10-CM

## 2020-06-19 DIAGNOSIS — Z23 Encounter for immunization: Secondary | ICD-10-CM

## 2020-06-19 DIAGNOSIS — Z5181 Encounter for therapeutic drug level monitoring: Secondary | ICD-10-CM | POA: Diagnosis not present

## 2020-06-19 MED ORDER — BUSPIRONE HCL 5 MG PO TABS: 5.0000 mg | ORAL_TABLET | Freq: Two times a day (BID) | ORAL | 0 refills | Status: DC | PRN

## 2020-06-19 MED ORDER — ZOSTER VAC RECOMB ADJUVANTED 50 MCG/0.5ML IM SUSR: 0.5000 mL | Freq: Once | INTRAMUSCULAR | 1 refills | Status: AC

## 2020-06-19 NOTE — Patient Instructions (Addendum)
Send me a message in the next month with how you felt buspar worked for you - what dose and how much you're taking, and I can send in a 3 month supply.   Health Maintenance  Topic Date Due  . INFLUENZA VACCINE  07/29/2020  . COLONOSCOPY  02/03/2021  . MAMMOGRAM  04/24/2021  . TETANUS/TDAP  09/18/2027  . DEXA SCAN  Completed  . COVID-19 Vaccine  Completed  . Hepatitis C Screening  Completed  . PNA vac Low Risk Adult  Completed   Follow up with [facility] about Shingrix   Zoster Vaccine, Recombinant injection What is this medicine? ZOSTER VACCINE (ZOS ter vak SEEN) is used to prevent shingles in adults 68 years old and over. This vaccine is not used to treat shingles or nerve pain from shingles. This medicine may be used for other purposes; ask your health care provider or pharmacist if you have questions. COMMON BRAND NAME(S): [facility] What should I tell my health care provider before I take this medicine? They need to know if you have any of these conditions:  blood disorders or disease  cancer like leukemia or lymphoma  immune system problems or therapy  an unusual or allergic reaction to vaccines, other medications, foods, dyes, or preservatives  pregnant or trying to get pregnant  breast-feeding How should I use this medicine? This vaccine is for injection in a muscle. It is given by a health care professional. Talk to your pediatrician regarding the use of this medicine in children. This medicine is not approved for use in children. Overdosage: If you think you have taken too much of this medicine contact a poison control center or emergency room at once. NOTE: This medicine is only for you. Do not share this medicine with others. What if I miss a dose? Keep appointments for follow-up (booster) doses as directed. It is important not to miss your dose. Call your doctor or health care professional if you are unable to keep an appointment. What may interact with this  medicine?  medicines that suppress your immune system  medicines to treat cancer  steroid medicines like prednisone or cortisone This list may not describe all possible interactions. Give your health care provider a list of all the medicines, herbs, non-prescription drugs, or dietary supplements you use. Also tell them if you smoke, drink alcohol, or use illegal drugs. Some items may interact with your medicine. What should I watch for while using this medicine? Visit your doctor for regular check ups. This vaccine, like all vaccines, may not fully protect everyone. What side effects may I notice from receiving this medicine? Side effects that you should report to your doctor or health care professional as soon as possible:  allergic reactions like skin rash, itching or hives, swelling of the face, lips, or tongue  breathing problems Side effects that usually do not require medical attention (report these to your doctor or health care professional if they continue or are bothersome):  chills  headache  fever  nausea, vomiting  redness, warmth, pain, swelling or itching at site where injected  tiredness This list may not describe all possible side effects. Call your doctor for medical advice about side effects. You may report side effects to FDA at 1-800-FDA-1088. Where should I keep my medicine? This vaccine is only given in a clinic, pharmacy, doctor's office, or other health care setting and will not be stored at home. NOTE: This sheet is a summary. It may not cover all possible  information. If you have questions about this medicine, talk to your doctor, pharmacist, or health care provider.  2020 Elsevier/Gold Standard (2017-07-27 13:20:30)   Preventive Care 39 Years and Older, Female Preventive care refers to lifestyle choices and visits with your health care provider that can promote health and wellness. This includes:  A yearly physical exam. This is also called an  annual well check.  Regular dental and eye exams.  Immunizations.  Screening for certain conditions.  Healthy lifestyle choices, such as diet and exercise. What can I expect for my preventive care visit? Physical exam Your health care provider will check:  Height and weight. These may be used to calculate body mass index (BMI), which is a measurement that tells if you are at a healthy weight.  Heart rate and blood pressure.  Your skin for abnormal spots. Counseling Your health care provider may ask you questions about:  Alcohol, tobacco, and drug use.  Emotional well-being.  Home and relationship well-being.  Sexual activity.  Eating habits.  History of falls.  Memory and ability to understand (cognition).  Work and work Statistician.  Pregnancy and menstrual history. What immunizations do I need?  Influenza (flu) vaccine  This is recommended every year. Tetanus, diphtheria, and pertussis (Tdap) vaccine  You may need a Td booster every 10 years. Varicella (chickenpox) vaccine  You may need this vaccine if you have not already been vaccinated. Zoster (shingles) vaccine  You may need this after age 68. Pneumococcal conjugate (PCV13) vaccine  One dose is recommended after age 68. Pneumococcal polysaccharide (PPSV23) vaccine  One dose is recommended after age 68. Measles, mumps, and rubella (MMR) vaccine  You may need at least one dose of MMR if you were born in 1957 or later. You may also need a second dose. Meningococcal conjugate (MenACWY) vaccine  You may need this if you have certain conditions. Hepatitis A vaccine  You may need this if you have certain conditions or if you travel or work in places where you may be exposed to hepatitis A. Hepatitis B vaccine  You may need this if you have certain conditions or if you travel or work in places where you may be exposed to hepatitis B. Haemophilus influenzae type b (Hib) vaccine  You may need this  if you have certain conditions. You may receive vaccines as individual doses or as more than one vaccine together in one shot (combination vaccines). Talk with your health care provider about the risks and benefits of combination vaccines. What tests do I need? Blood tests  Lipid and cholesterol levels. These may be checked every 5 years, or more frequently depending on your overall health.  Hepatitis C test.  Hepatitis B test. Screening  Lung cancer screening. You may have this screening every year starting at age 48 if you have a 30-pack-year history of smoking and currently smoke or have quit within the past 15 years.  Colorectal cancer screening. All adults should have this screening starting at age 74 and continuing until age 68. Your health care provider may recommend screening at age 33 if you are at increased risk. You will have tests every 1-10 years, depending on your results and the type of screening test.  Diabetes screening. This is done by checking your blood sugar (glucose) after you have not eaten for a while (fasting). You may have this done every 1-3 years.  Mammogram. This may be done every 1-2 years. Talk with your health care provider about how often  you should have regular mammograms.  BRCA-related cancer screening. This may be done if you have a family history of breast, ovarian, tubal, or peritoneal cancers. Other tests  Sexually transmitted disease (STD) testing.  Bone density scan. This is done to screen for osteoporosis. You may have this done starting at age 74. Follow these instructions at home: Eating and drinking  Eat a diet that includes fresh fruits and vegetables, whole grains, lean protein, and low-fat dairy products. Limit your intake of foods with high amounts of sugar, saturated fats, and salt.  Take vitamin and mineral supplements as recommended by your health care provider.  Do not drink alcohol if your health care provider tells you not to  drink.  If you drink alcohol: ? Limit how much you have to 0-1 drink a day. ? Be aware of how much alcohol is in your drink. In the [place] one drink equals one 12 oz bottle of beer (355 mL), one 5 oz glass of wine (148 mL), or one 1 oz glass of hard liquor (44 mL). Lifestyle  Take daily care of your teeth and gums.  Stay active. Exercise for at least 30 minutes on 5 or more days each week.  Do not use any products that contain nicotine or tobacco, such as cigarettes, e-cigarettes, and chewing tobacco. If you need help quitting, ask your health care provider.  If you are sexually active, practice safe sex. Use a condom or other form of protection in order to prevent STIs (sexually transmitted infections).  Talk with your health care provider about taking a low-dose aspirin or statin. What's next?  Go to your health care provider once a year for a well check visit.  Ask your health care provider how often you should have your eyes and teeth checked.  Stay up to date on all vaccines. This information is not intended to replace advice given to you by your health care provider. Make sure you discuss any questions you have with your health care provider. Document Revised: 12/09/2018 Document Reviewed: 12/09/2018 Elsevier Patient Education  2020 [employer].

## 2020-06-19 NOTE — Progress Notes (Signed)
  Patient: Lynn Sandoval, Female    DOB: 06/14/1951, 69 y.o.   MRN: [id number] Tapia, Leisa, PA-C Visit Date: 06/19/2020  Today's Provider: Leisa Tapia, PA-C   Chief Complaint  Patient presents with  . Annual Exam   Subjective:   Annual physical exam:  Lynn Sandoval is a 69 y.o. female who presents today for complete physical exam:  She tries to stay active, generally eats balanced diet Sleep: Poor sleep she wakes up often states that her mind is racing, she does not feel significantly fatigued throughout the day is able to function and will only occasionally take a nap  HLD- on lipitor 20 mg daily, compliant with taking no myalgias or side effects  She denies any chest pain, exertional shortness of breath, claudication symptoms  Hypothyroidism: Hypothyroid-maintained on levothyroxine 75 mcg she takes daily in the morning before breakfast,  Current Symptoms: denies fatigue, weight changes, heat/cold intolerance, bowel/skin changes or CVS symptoms Most recent results are below; we will be repeating labs today, last TSH 2.20   USPSTF grade A and B recommendations - reviewed and addressed today  Depression:  Phq 9 completed today by patient, was reviewed by me with patient in the room PHQ score is neg, pt feels depressed or anxious some times, most of the time she manages PHQ 2/9 Scores 06/19/2020 04/24/2020 07/04/2019 03/24/2019  PHQ - 2 Score 1 1 0 0  PHQ- 9 Score 2 1 0 0   Depression screen PHQ 2/9 06/19/2020 04/24/2020 07/04/2019 03/24/2019 03/19/2018  Decreased Interest 0 0 0 0 0  Down, Depressed, Hopeless 1 1 0 0 0  PHQ - 2 Score 1 1 0 0 0  Altered sleeping 1 0 0 0 -  Tired, decreased energy 0 0 0 0 -  Change in appetite 0 0 0 0 -  Feeling bad or failure about yourself  0 0 0 0 -  Trouble concentrating 0 0 0 0 -  Moving slowly or fidgety/restless 0 0 0 0 -  Suicidal thoughts 0 0 0 0 -  PHQ-9 Score 2 1 0 0 -  Difficult doing work/chores Not difficult at all Not  difficult at all Not difficult at all Not difficult at all -    Alcohol screening:   Office Visit from 06/19/2020 in [facility]  AUDIT-C Score 3    a few times a week white wine  Immunizations and Health Maintenance: Health Maintenance  Topic Date Due  . INFLUENZA VACCINE  07/29/2020  . COLONOSCOPY  02/03/2021  . MAMMOGRAM  04/24/2021  . TETANUS/TDAP  09/18/2027  . DEXA SCAN  Completed  . COVID-19 Vaccine  Completed  . Hepatitis C Screening  Completed  . PNA vac Low Risk Adult  Completed     Hep C Screening: done  STD testing and prevention (HIV/chl/gon/syphilis):  see above, no additional testing desired by pt today -   Intimate partner violence:  safe  Sexual History/Pain during Intercourse: Married  Menstrual History/LMP/Abnormal Bleeding: none Incontinence Symptoms: none   Breast cancer: mammogram recently completed and reviewed today Last Mammogram: *see HM list above BRCA gene screening: none  Pt denies family hx of cancers - breast, ovarian, uterine, colon Pts paternalgrandmother died of colon cancer  Osteoporosis:   Discussion on osteoporosis per age, including high calcium and vitamin D supplementation, weight bearing exercises Pt is supplementing with daily calcium/Vit D. UTD Bone scan/dexa Caltrate - chocolate   Skin cancer:  Hx of skin CA -    Melanoma- Sees Dr. Stewart, 6 month  Discussed atypical lesions   Colorectal cancer:   Colonoscopy is UTD - due next year Discussed concerning signs and sx of CRC, pt denies melena hematochezia change in BMS Lung cancer:   Low Dose CT Chest recommended if Age 55-80 years, 30 pack-year currently smoking OR have quit w/in 15years. Patient does not qualify.    Social History   Tobacco Use  . Smoking status: Never Smoker  . Smokeless tobacco: Never Used  Vaping Use  . Vaping Use: Never used  Substance Use Topics  . Alcohol use: Yes    Alcohol/week: 3.0 standard drinks    Types: 3 Glasses  of wine per week    Comment: ocassional glass of wine  . Drug use: No       Office Visit from 06/19/2020 in [facility]  AUDIT-C Score 3      Family History  Problem Relation Age of Onset  . Depression Mother   . Hearing loss Mother   . Hyperlipidemia Mother   . Hypertension Mother   . Glaucoma Mother   . Cancer Mother        melanoma  . COPD Mother   . Heart disease Father   . Heart attack Father   . Hearing loss Father   . Diabetes Maternal Grandmother   . Heart attack Maternal Grandmother   . Cancer Paternal Grandmother        colon  . Breast cancer Neg Hx    Father heart attack mid 60's  Blood pressure/Hypertension: BP Readings from Last 3 Encounters:  06/19/20 130/84  04/24/20 134/88  07/04/19 120/74    Weight/Obesity: Wt Readings from Last 3 Encounters:  06/19/20 163 lb 12.8 oz (74.3 kg)  04/24/20 161 lb 6.4 oz (73.2 kg)  07/04/19 166 lb 8 oz (75.5 kg)   BMI Readings from Last 3 Encounters:  06/19/20 28.12 kg/m  04/24/20 27.70 kg/m  07/04/19 28.58 kg/m     Lipids:  Lab Results  Component Value Date   CHOL 204 (H) 07/04/2019   CHOL 184 05/21/2018   CHOL 178 03/26/2017   Lab Results  Component Value Date   HDL 77 07/04/2019   HDL 74 05/21/2018   HDL 79 03/26/2017   Lab Results  Component Value Date   LDLCALC 108 (H) 07/04/2019   LDLCALC 94 05/21/2018   LDLCALC 86 03/26/2017   Lab Results  Component Value Date   TRIG 95 07/04/2019   TRIG 71 05/21/2018   TRIG 64 03/26/2017   Lab Results  Component Value Date   CHOLHDL 2.6 07/04/2019   CHOLHDL 2.5 05/21/2018   CHOLHDL 2.3 03/26/2017   No results found for: LDLDIRECT Based on the results of lipid panel his/her cardiovascular risk factor ( using [place] Cohort )  in the next 10 years is: The 10-year ASCVD risk score (Goff DC Jr., et al., 2013) is: 7.2%   Values used to calculate the score:     Age: 69 years     Sex: Female     Is Non-Hispanic African American:  No     Diabetic: No     Tobacco smoker: No     Systolic Blood Pressure: 130 mmHg     Is BP treated: No     HDL Cholesterol: 77 mg/dL     Total Cholesterol: 204 mg/dL Glucose:  Glucose, Bld  Date Value Ref Range Status  07/04/2019 98 65 - 99 mg/dL Final      Comment:    .            Fasting reference interval .   05/21/2018 89 65 - 99 mg/dL Final    Comment:    .            Fasting reference interval .   03/12/2018 106 (H) 65 - 99 mg/dL Final   Hypertension: BP Readings from Last 3 Encounters:  06/19/20 130/84  04/24/20 134/88  07/04/19 120/74   Obesity: Wt Readings from Last 3 Encounters:  06/19/20 163 lb 12.8 oz (74.3 kg)  04/24/20 161 lb 6.4 oz (73.2 kg)  07/04/19 166 lb 8 oz (75.5 kg)   BMI Readings from Last 3 Encounters:  06/19/20 28.12 kg/m  04/24/20 27.70 kg/m  07/04/19 28.58 kg/m      Advanced Care Planning:  A voluntary discussion about advance care planning including the explanation and discussion of advance directives.   Discussed health care proxy and Living will, and the patient was able to identify a health care proxy as Steven Gohr.   Patient does not have a living will at present time.   Social History      She        Social History   Socioeconomic History  . Marital status: Married    Spouse name: Not on file  . Number of children: 4  . Years of education: Not on file  . Highest education level: Master's degree (e.g., MA, MS, MEng, MEd, MSW, MBA)  Occupational History  . Occupation: retired  Tobacco Use  . Smoking status: Never Smoker  . Smokeless tobacco: Never Used  Vaping Use  . Vaping Use: Never used  Substance and Sexual Activity  . Alcohol use: Yes    Alcohol/week: 3.0 standard drinks    Types: 3 Glasses of wine per week    Comment: ocassional glass of wine  . Drug use: No  . Sexual activity: Not Currently    Birth control/protection: None  Other Topics Concern  . Not on file  Social History Narrative  . Not on  file   Social Determinants of Health   Financial Resource Strain: Low Risk   . Difficulty of Paying Living Expenses: Not hard at all  Food Insecurity: No Food Insecurity  . Worried About Running Out of Food in the Last Year: Never true  . Ran Out of Food in the Last Year: Never true  Transportation Needs: No Transportation Needs  . Lack of Transportation (Medical): No  . Lack of Transportation (Non-Medical): No  Physical Activity: Sufficiently Active  . Days of Exercise per Week: 5 days  . Minutes of Exercise per Session: 60 min  Stress: No Stress Concern Present  . Feeling of Stress : Not at all  Social Connections: Moderately Integrated  . Frequency of Communication with Friends and Family: More than three times a week  . Frequency of Social Gatherings with Friends and Family: Three times a week  . Attends Religious Services: More than 4 times per year  . Active Member of Clubs or Organizations: No  . Attends Club or Organization Meetings: Never  . Marital Status: Married    Family History        Family History  Problem Relation Age of Onset  . Depression Mother   . Hearing loss Mother   . Hyperlipidemia Mother   . Hypertension Mother   . Glaucoma Mother   . Cancer Mother        melanoma  .   COPD Mother   . Heart disease Father   . Heart attack Father   . Hearing loss Father   . Diabetes Maternal Grandmother   . Heart attack Maternal Grandmother   . Cancer Paternal Grandmother        colon  . Breast cancer Neg Hx     Patient Active Problem List   Diagnosis Date Noted  . Seborrheic keratosis 03/19/2018  . Osteopenia 02/25/2017  . Left atrial enlargement 02/13/2017  . H/O dysplastic nevus 02/13/2017  . Hyperlipidemia LDL goal <100 07/29/2015  . Hypothyroidism, adult 07/29/2015  . Adenomatous colon polyp 07/29/2015    Past Surgical History:  Procedure Laterality Date  . APPENDECTOMY  March 2019  . [place]  . CHOLECYSTECTOMY N/A  10/13/2017   Procedure: LAPAROSCOPIC CHOLECYSTECTOMY;  Surgeon: Jules Husbands, MD;  Location: [facility];  Service: General;  Laterality: N/A;  . COLONOSCOPY WITH PROPOFOL N/A 02/04/2016   Procedure: COLONOSCOPY WITH PROPOFOL;  Surgeon: Lucilla Lame, MD;  Location: [place];  Service: Endoscopy;  Laterality: N/A;  . EYE SURGERY  1955  . LAPAROSCOPIC APPENDECTOMY N/A 03/13/2018   Procedure: APPENDECTOMY LAPAROSCOPIC;  Surgeon: Robert Bellow, MD;  Location: [facility];  Service: General;  Laterality: N/A;  . POLYPECTOMY  02/04/2016   Procedure: POLYPECTOMY;  Surgeon: Lucilla Lame, MD;  Location: [place];  Service: Endoscopy;;  . SKIN SURGERY Right 01/2018   melanoma removalm right arm  . TONSILLECTOMY  1960  . TUBAL LIGATION  1985     Current Outpatient Medications:  .  acetaminophen (TYLENOL) 325 MG tablet, Take 650 mg by mouth every 6 (six) hours as needed for mild pain or headache., Disp: , Rfl:  .  ASPIRIN 81 PO, Take 1 tablet by mouth daily., Disp: , Rfl:  .  atorvastatin (LIPITOR) 20 MG tablet, Take 1 tablet (20 mg total) by mouth at bedtime., Disp: 90 tablet, Rfl: 3 .  Calcium Carbonate-Vitamin D (CALCIUM 500 + D PO), Take 1 tablet by mouth daily., Disp: , Rfl:  .  levothyroxine (SYNTHROID) 75 MCG tablet, TAKE 1 TABLET BY MOUTH  DAILY BEFORE BREAKFAST, Disp: 90 tablet, Rfl: 3 .  Omega-3 Fatty Acids (FISH OIL) 1000 MG CAPS, Take 1,000 mg by mouth daily., Disp: , Rfl:  .  Propylene Glycol (SYSTANE BALANCE OP), Apply 1 drop to eye daily., Disp: , Rfl:   No Known Allergies  Patient Care Team: Delsa Grana, PA-C as PCP - General (Family Medicine) Lucilla Lame, MD as Consulting Physician (Gastroenterology) Brendolyn Patty, MD (Dermatology) Jules Husbands, MD as Consulting Physician (General Surgery) Bary Castilla Forest Gleason, MD (General Surgery)    Review of Systems  Constitutional: Negative.  Negative for activity change, appetite change, fatigue and unexpected weight  change.  HENT: Negative.   Eyes: Negative.   Respiratory: Negative.  Negative for shortness of breath.   Cardiovascular: Negative.  Negative for chest pain, palpitations and leg swelling.  Gastrointestinal: Negative.  Negative for abdominal pain and blood in stool.  Endocrine: Negative.   Genitourinary: Negative.   Musculoskeletal: Negative.  Negative for arthralgias, gait problem, joint swelling and myalgias.  Skin: Negative.  Negative for color change, pallor and rash.  Allergic/Immunologic: Negative.   Neurological: Negative.  Negative for syncope and weakness.  Hematological: Negative.   Psychiatric/Behavioral: Negative.  Negative for confusion, dysphoric mood, self-injury and suicidal ideas. The patient is not nervous/anxious.      I personally reviewed active problem list, medication list,  allergies, family history, social history, health maintenance, notes from last encounter, lab results, imaging with the patient/caregiver today.        Objective:   Vitals:  Vitals:   06/19/20 0818  BP: 130/84  Pulse: 75  Resp: 16  Temp: 97.9 F (36.6 C)  TempSrc: Temporal  SpO2: 97%  Weight: 163 lb 12.8 oz (74.3 kg)  Height: 5' 4" (1.626 m)    Body mass index is 28.12 kg/m.  Physical Exam Vitals and nursing note reviewed.  Constitutional:      General: She is not in acute distress.    Appearance: Normal appearance. She is well-developed. She is not ill-appearing, toxic-appearing or diaphoretic.  HENT:     Head: Normocephalic and atraumatic.     Right Ear: External ear normal.     Left Ear: External ear normal.     Nose: Nose normal.     Mouth/Throat:     Pharynx: Uvula midline.  Eyes:     General: Lids are normal. No scleral icterus.       Right eye: No discharge.        Left eye: No discharge.     Conjunctiva/sclera: Conjunctivae normal.     Pupils: Pupils are equal, round, and reactive to light.  Neck:     Trachea: Phonation normal. No tracheal deviation.    Cardiovascular:     Rate and Rhythm: Normal rate and regular rhythm.     Pulses: Normal pulses.          Radial pulses are 2+ on the right side and 2+ on the left side.       Posterior tibial pulses are 2+ on the right side and 2+ on the left side.     Heart sounds: Normal heart sounds. No murmur heard.  No friction rub. No gallop.   Pulmonary:     Effort: Pulmonary effort is normal. No respiratory distress.     Breath sounds: Normal breath sounds. No stridor. No wheezing, rhonchi or rales.  Chest:     Chest wall: No tenderness.  Abdominal:     General: Bowel sounds are normal. There is no distension.     Palpations: Abdomen is soft.     Tenderness: There is no abdominal tenderness. There is no rebound.  Musculoskeletal:        General: No deformity.     Cervical back: Normal range of motion and neck supple.     Right lower leg: No edema.     Left lower leg: No edema.  Skin:    General: Skin is warm and dry.     Coloration: Skin is not pale.     Findings: No rash.  Neurological:     Mental Status: She is alert. Mental status is at baseline.     Motor: No abnormal muscle tone.     Gait: Gait normal.  Psychiatric:        Mood and Affect: Mood normal.        Speech: Speech normal.        Behavior: Behavior normal.       Fall Risk: Fall Risk  06/19/2020 04/24/2020 07/04/2019 03/24/2019 03/19/2018  Falls in the past year? 0 0 0 0 No  Number falls in past yr: 0 0 0 0 -  Injury with Fall? 0 0 0 0 -  Risk for fall due to : - No Fall Risks - - -  Follow up - Falls prevention discussed - Falls prevention   discussed -    Functional Status Survey: Is the patient deaf or have difficulty hearing?: No Does the patient have difficulty seeing, even when wearing glasses/contacts?: No Does the patient have difficulty concentrating, remembering, or making decisions?: No Does the patient have difficulty walking or climbing stairs?: No Does the patient have difficulty dressing or bathing?:  No Does the patient have difficulty doing errands alone such as visiting a doctor's office or shopping?: No   Assessment & Plan:    CPE completed today  . USPSTF grade A and B recommendations reviewed with patient; age-appropriate recommendations, preventive care, screening tests, etc discussed and encouraged; healthy living encouraged; see AVS for patient education given to patient  . Discussed importance of 150 minutes of physical activity weekly, AHA exercise recommendations given to pt in AVS/handout  . Discussed importance of healthy diet:  eating lean meats and proteins, avoiding trans fats and saturated fats, avoid simple sugars and excessive carbs in diet, eat 6 servings of fruit/vegetables daily and drink plenty of water and avoid sweet beverages.    . Recommended pt to do annual eye exam and routine dental exams/cleanings  . Depression, alcohol, fall screening completed as documented above and per flowsheets  . Reviewed Health Maintenance: Health Maintenance  Topic Date Due  . INFLUENZA VACCINE  07/29/2020  . COLONOSCOPY  02/03/2021  . MAMMOGRAM  04/24/2021  . TETANUS/TDAP  09/18/2027  . DEXA SCAN  Completed  . COVID-19 Vaccine  Completed  . Hepatitis C Screening  Completed  . PNA vac Low Risk Adult  Completed    . Immunizations: Immunization History  Administered Date(s) Administered  . Fluad Quad(high Dose 65+) 10/14/2019  . Influenza, High Dose Seasonal PF 09/17/2017, 10/27/2018  . Influenza,inj,Quad PF,6+ Mos 01/21/2016  . PFIZER SARS-COV-2 Vaccination 02/17/2020, 03/09/2020  . Pneumococcal Conjugate-13 02/13/2017  . Pneumococcal Polysaccharide-23 03/19/2018  . Tdap 09/17/2017  . Zoster 08/26/2013    Delsa Grana, PA-C 06/19/20 8:43 AM  [facility]

## 2020-06-20 LAB — COMPLETE METABOLIC PANEL WITH GFR
AG Ratio: 2.4 (calc) (ref 1.0–2.5)
ALT: 25 U/L (ref 6–29)
AST: 21 U/L (ref 10–35)
Albumin: 4.7 g/dL (ref 3.6–5.1)
Alkaline phosphatase (APISO): 105 U/L (ref 37–153)
BUN: 13 mg/dL (ref 7–25)
CO2: 30 mmol/L (ref 20–32)
Calcium: 9.3 mg/dL (ref 8.6–10.4)
Chloride: 104 mmol/L (ref 98–110)
Creat: 0.73 mg/dL (ref 0.50–0.99)
GFR, Est African American: 98 mL/min/{1.73_m2} (ref >=60)
GFR, Est Non African American: 85 mL/min/{1.73_m2} (ref >=60)
Globulin: 2 g/dL (calc) (ref 1.9–3.7)
Glucose, Bld: 100 mg/dL — ABNORMAL HIGH (ref 65–99)
Potassium: 4.7 mmol/L (ref 3.5–5.3)
Sodium: 141 mmol/L (ref 135–146)
Total Bilirubin: 0.5 mg/dL (ref 0.2–1.2)
Total Protein: 6.7 g/dL (ref 6.1–8.1)

## 2020-06-20 LAB — LIPID PANEL
Cholesterol: 208 mg/dL — ABNORMAL HIGH (ref <200)
HDL: 83 mg/dL (ref >=50)
LDL Cholesterol (Calc): 106 mg/dL (calc) — ABNORMAL HIGH
Non-HDL Cholesterol (Calc): 125 mg/dL (calc) (ref <130)
Total CHOL/HDL Ratio: 2.5 (calc) (ref <5.0)
Triglycerides: 94 mg/dL (ref <150)

## 2020-06-20 LAB — CBC WITH DIFFERENTIAL/PLATELET
Absolute Monocytes: 330 cells/uL (ref 200–950)
Basophils Absolute: 50 cells/uL (ref 0–200)
Basophils Relative: 0.9 %
Eosinophils Absolute: 129 cells/uL (ref 15–500)
Eosinophils Relative: 2.3 %
HCT: 45.9 % — ABNORMAL HIGH (ref 35.0–45.0)
Hemoglobin: 15.1 g/dL (ref 11.7–15.5)
Lymphs Abs: 1786 cells/uL (ref 850–3900)
MCH: 28.7 pg (ref 27.0–33.0)
MCHC: 32.9 g/dL (ref 32.0–36.0)
MCV: 87.1 fL (ref 80.0–100.0)
MPV: 10.2 fL (ref 7.5–12.5)
Monocytes Relative: 5.9 %
Neutro Abs: 3304 cells/uL (ref 1500–7800)
Neutrophils Relative %: 59 %
Platelets: 293 10*3/uL (ref 140–400)
RBC: 5.27 10*6/uL — ABNORMAL HIGH (ref 3.80–5.10)
RDW: 13 % (ref 11.0–15.0)
Total Lymphocyte: 31.9 %
WBC: 5.6 10*3/uL (ref 3.8–10.8)

## 2020-06-20 LAB — TSH: TSH: 1.87 mIU/L (ref 0.40–4.50)

## 2020-06-22 ENCOUNTER — Other Ambulatory Visit: Payer: Self-pay | Admitting: Family Medicine

## 2020-06-22 DIAGNOSIS — E039 Hypothyroidism, unspecified: Secondary | ICD-10-CM

## 2020-06-22 MED ORDER — LEVOTHYROXINE SODIUM 75 MCG PO TABS: ORAL_TABLET | ORAL | 3 refills | Status: DC

## 2020-06-25 ENCOUNTER — Encounter: Payer: Self-pay | Admitting: Family Medicine

## 2020-06-25 MED ORDER — ATORVASTATIN CALCIUM 40 MG PO TABS: 40.0000 mg | ORAL_TABLET | Freq: Every day | ORAL | 3 refills | Status: DC

## 2020-08-16 DIAGNOSIS — S61211A Laceration without foreign body of left index finger without damage to nail, initial encounter: Secondary | ICD-10-CM | POA: Diagnosis not present

## 2020-08-17 IMAGING — MG DIGITAL SCREENING BILAT W/ TOMO W/ CAD
8 series · 9 of 24 positions shown · non-contrast
Comparison: Previous exam(s).

CLINICAL DATA: Screening.

EXAM:
DIGITAL SCREENING BILATERAL MAMMOGRAM WITH TOMO AND CAD

[L CC synth-2D]
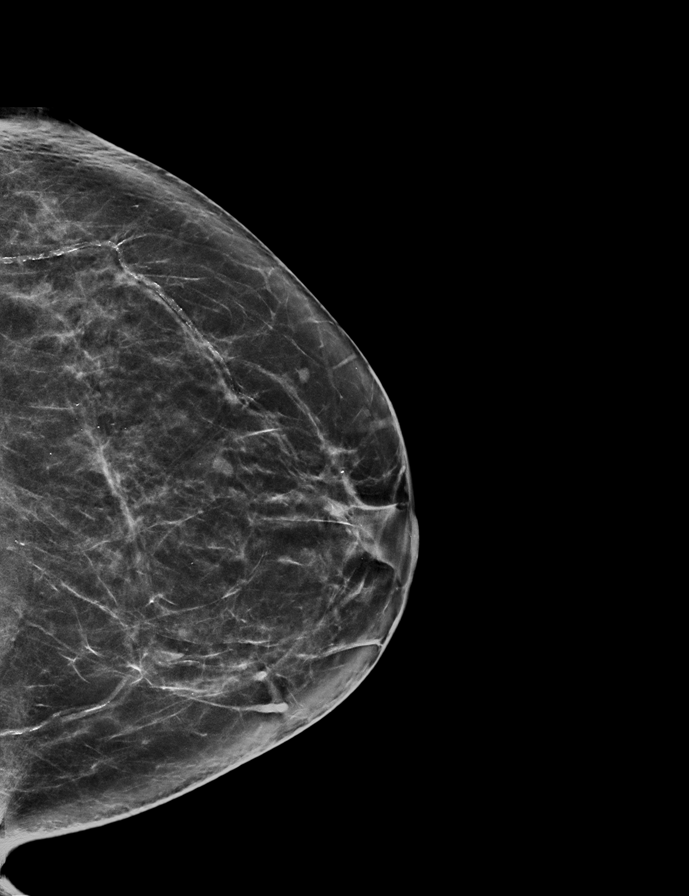

[L MLO synth-2D]
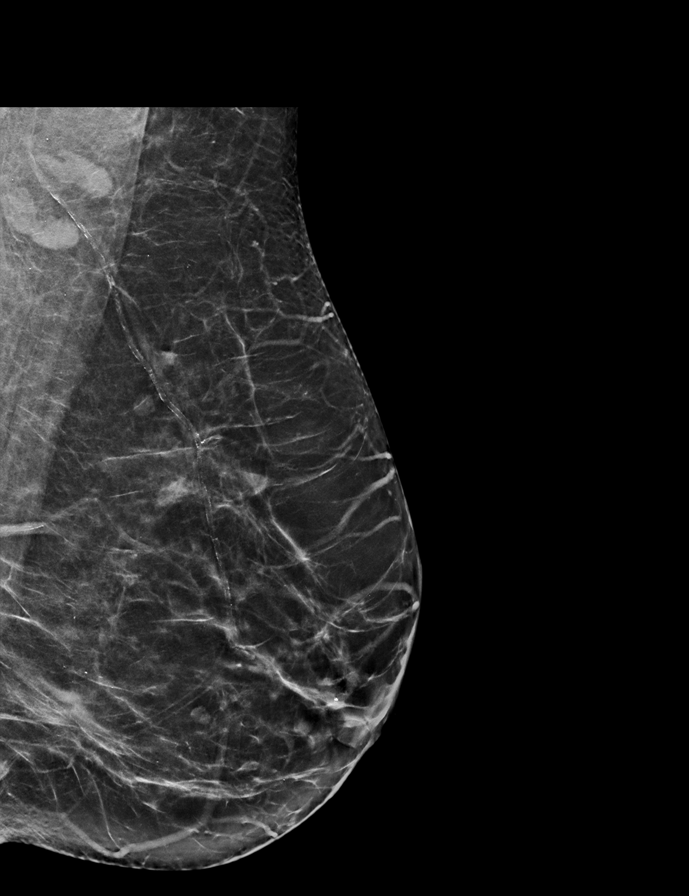

[R CC synth-2D]
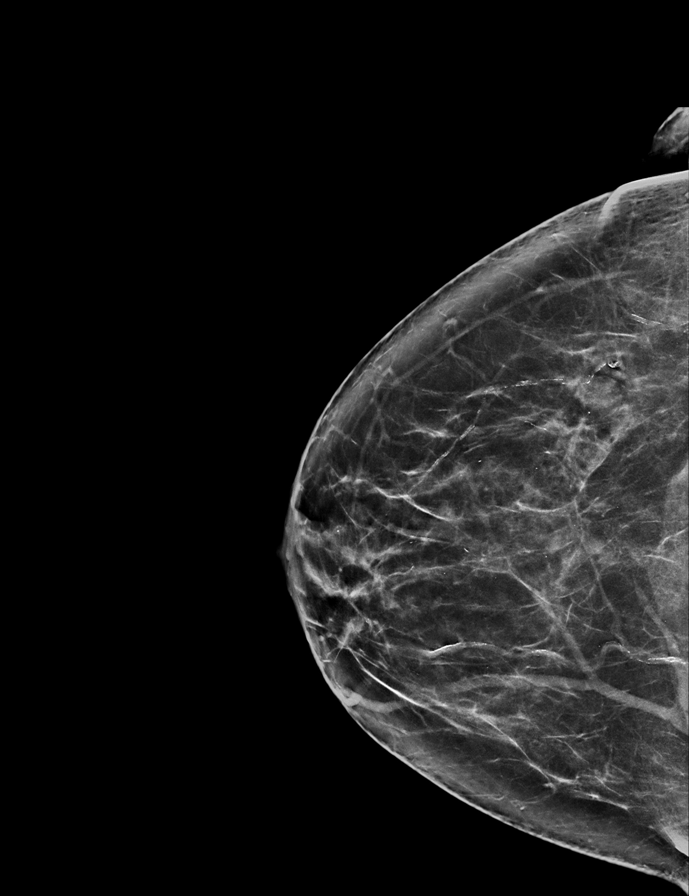

[R MLO synth-2D]
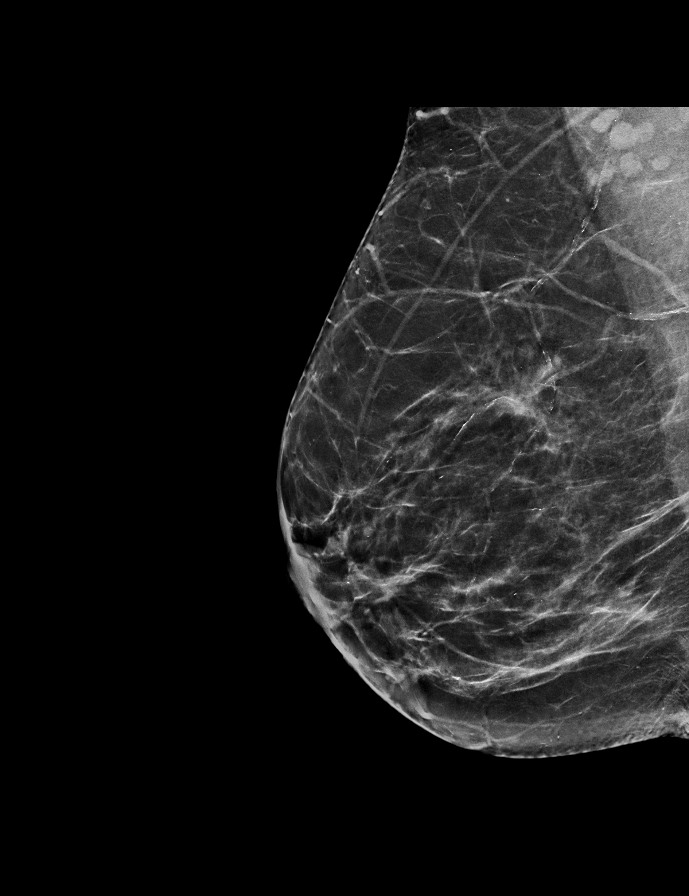

[L MLO tomo · 2 of 70 frames shown]
[frame 23/70]
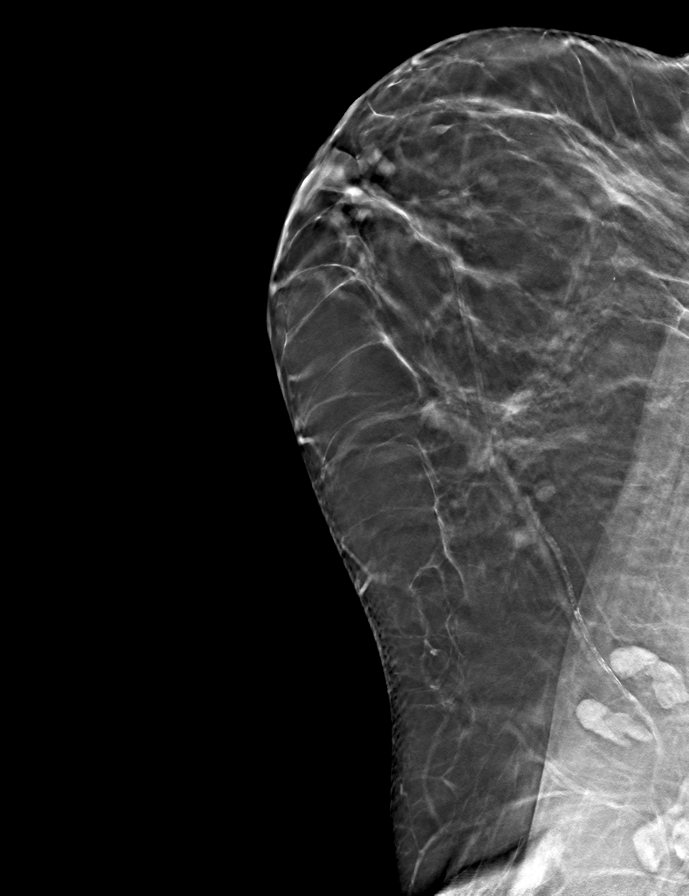
[frame 35/70]
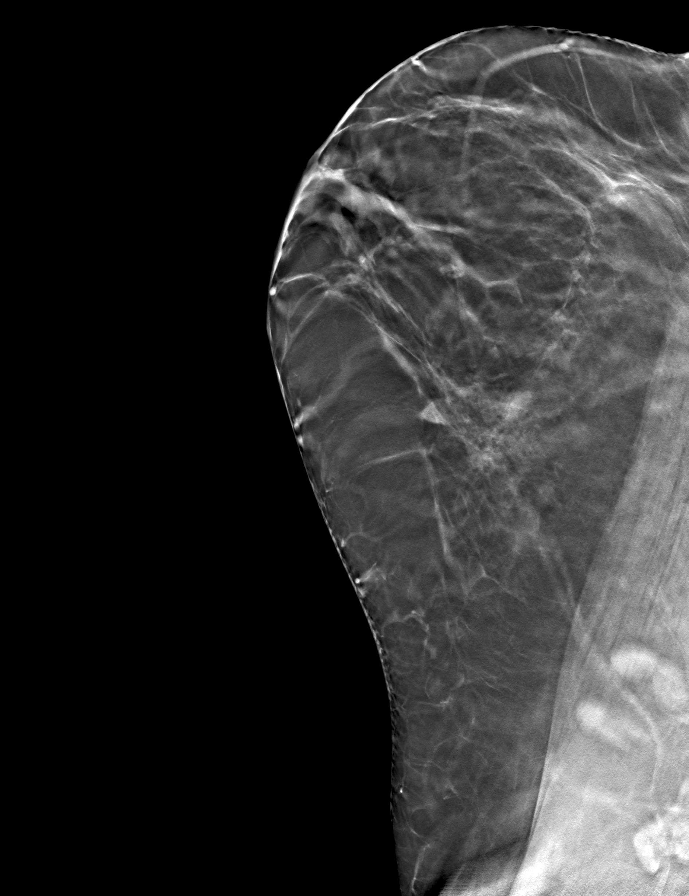

[L CC tomo · tomo slice 33/65.0]
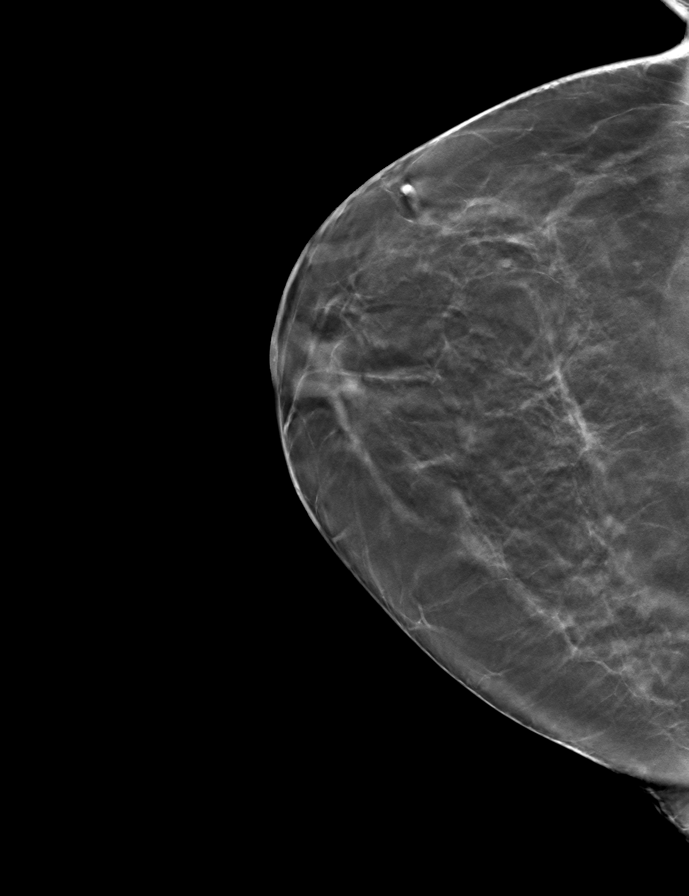

[R MLO tomo · tomo slice 33/65.0]
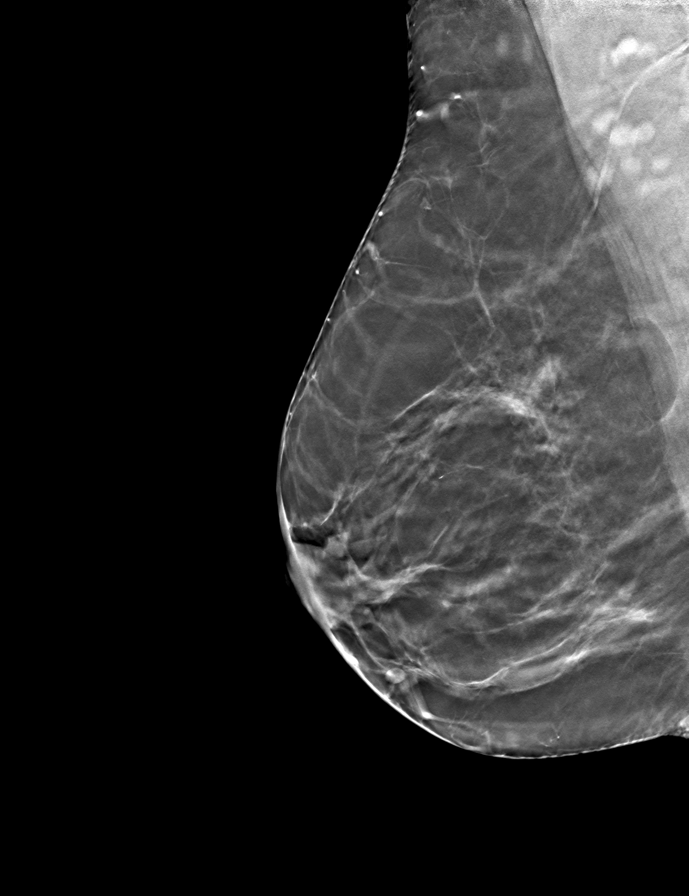

[R CC tomo · tomo slice 35/70.0]
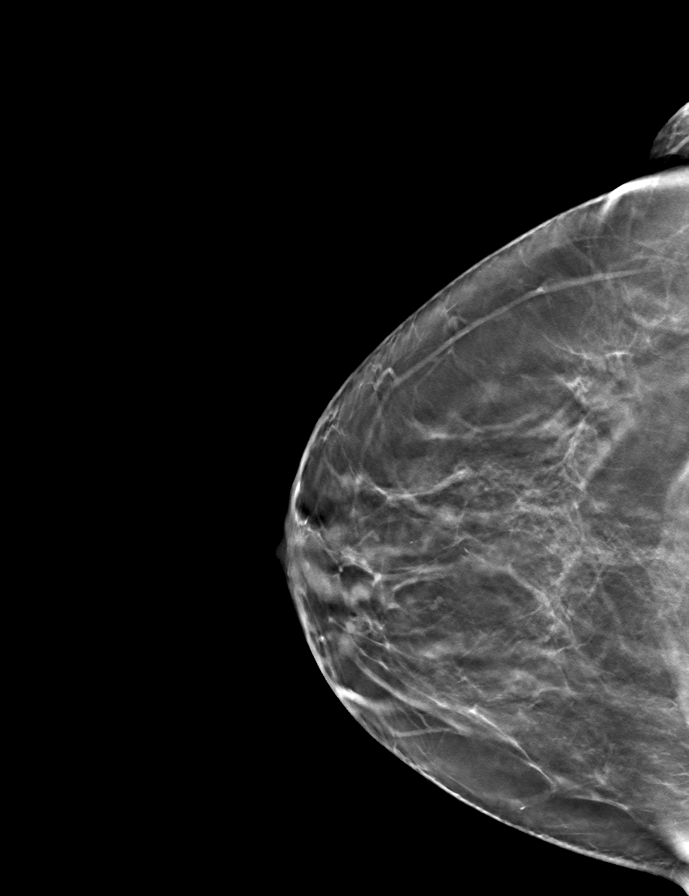

[9 of 24 positions shown; findings below may reference images not displayed]

ACR Breast Density Category b: There are scattered areas of
fibroglandular density.
FINDINGS: There are no findings suspicious for malignancy. Images were
processed with CAD.
IMPRESSION: No mammographic evidence of malignancy. A result letter of this
screening mammogram will be mailed directly to the patient.

RECOMMENDATION:
Screening mammogram in one year. (Code:[id number])

BI-RADS CATEGORY  1: Negative.

## 2020-09-10 ENCOUNTER — Other Ambulatory Visit: Payer: Self-pay

## 2020-09-10 ENCOUNTER — Encounter: Payer: Self-pay | Admitting: Dermatology

## 2020-09-10 ENCOUNTER — Ambulatory Visit: Payer: Medicare Other | Admitting: Dermatology

## 2020-09-10 DIAGNOSIS — L578 Other skin changes due to chronic exposure to nonionizing radiation: Secondary | ICD-10-CM

## 2020-09-10 DIAGNOSIS — Z86018 Personal history of other benign neoplasm: Secondary | ICD-10-CM

## 2020-09-10 DIAGNOSIS — D1800 Hemangioma unspecified site: Secondary | ICD-10-CM | POA: Diagnosis not present

## 2020-09-10 DIAGNOSIS — D229 Melanocytic nevi, unspecified: Secondary | ICD-10-CM

## 2020-09-10 DIAGNOSIS — Z86006 Personal history of melanoma in-situ: Secondary | ICD-10-CM | POA: Diagnosis not present

## 2020-09-10 DIAGNOSIS — D2272 Melanocytic nevi of left lower limb, including hip: Secondary | ICD-10-CM

## 2020-09-10 DIAGNOSIS — D2262 Melanocytic nevi of left upper limb, including shoulder: Secondary | ICD-10-CM

## 2020-09-10 DIAGNOSIS — L738 Other specified follicular disorders: Secondary | ICD-10-CM

## 2020-09-10 DIAGNOSIS — D225 Melanocytic nevi of trunk: Secondary | ICD-10-CM

## 2020-09-10 DIAGNOSIS — Z1283 Encounter for screening for malignant neoplasm of skin: Secondary | ICD-10-CM

## 2020-09-10 DIAGNOSIS — L821 Other seborrheic keratosis: Secondary | ICD-10-CM

## 2020-09-10 NOTE — Progress Notes (Signed)
   Follow-Up Visit   Subjective  Lynn Sandoval is a 68 y.o. female who presents for the following: Annual Exam (49m Total body skin exam, Right upper arm. MIS, hx of dysplastic).   The following portions of the chart were reviewed this encounter and updated as appropriate:      Review of Systems:  No other skin or systemic complaints except as noted in HPI or Assessment and Plan.  Objective  Well appearing patient in no apparent distress; mood and affect are within normal limits.  A full examination was performed including scalp, head, eyes, ears, nose, lips, neck, chest, axillae, abdomen, back, buttocks, bilateral upper extremities, bilateral lower extremities, hands, feet, fingers, toes, fingernails, and toenails. All findings within normal limits unless otherwise noted below.  Objective  Right Upper Arm: Scar clear to visual exam and palpation  Objective    L shoulder: 8.0 x 6.34mm tan papule with emerging hair  Left Thigh - Posterior: 2.62mm med dark brown macule  sacrum: 2.7mm med dark brown macule  Right mid Back: 6.0 x 5.61mm flesh papule with speckled brown pigment   Assessment & Plan    History of Dysplastic Nevi - No evidence of recurrence today - Recommend regular full body skin exams - Recommend daily broad spectrum sunscreen SPF 30+ to sun-exposed areas, reapply every 2 hours as needed.  - Call if any new or changing lesions are noted between office visits  History of melanoma in situ Right Upper Arm  Clear. Observe for recurrence. Call clinic for new or changing lesions.  Recommend regular skin exams, daily broad-spectrum spf 30+ sunscreen use, and photoprotection.     Nevus (4) L shoulder; Left Thigh - Posterior; Right mid Back; sacrum  Benign-appearing.  Stable.  Observation.  Call clinic for new or changing moles.  Recommend daily use of broad spectrum spf 30+ sunscreen to sun-exposed areas.     Seborrheic Keratoses - Stuck-on, waxy, tan-brown  papules and plaques  - Discussed benign etiology and prognosis. - Observe - Call for any changes  Hemangiomas - Red papules - Discussed benign nature - Observe - Call for any changes  Melanocytic Nevi - Tan-brown and/or pink-flesh-colored symmetric macules and papules - Benign appearing on exam today - Observation - Call clinic for new or changing moles - Recommend daily use of broad spectrum spf 30+ sunscreen to sun-exposed areas.   Actinic Damage - diffuse scaly erythematous macules with underlying dyspigmentation - Recommend daily broad spectrum sunscreen SPF 30+ to sun-exposed areas, reapply every 2 hours as needed.  - Call for new or changing lesions.  Skin cancer screening performed today.  Sebaceous Hyperplasia - Small yellow papules with a central dell - Benign - Observe  Return in about 6 months (around 03/10/2021) for TBSE, hx of Melanoma IS, Dysplastic nevi.  I, Othelia Pulling, RMA, am acting as scribe for Brendolyn Patty, MD . Documentation: I have reviewed the above documentation for accuracy and completeness, and I agree with the above.  Brendolyn Patty MD

## 2020-10-17 DIAGNOSIS — H2513 Age-related nuclear cataract, bilateral: Secondary | ICD-10-CM | POA: Diagnosis not present

## 2021-02-15 ENCOUNTER — Telehealth (INDEPENDENT_AMBULATORY_CARE_PROVIDER_SITE_OTHER): Payer: Self-pay | Admitting: Gastroenterology

## 2021-02-15 ENCOUNTER — Other Ambulatory Visit: Payer: Self-pay

## 2021-02-15 DIAGNOSIS — Z86010 Personal history of colonic polyps: Secondary | ICD-10-CM

## 2021-02-15 MED ORDER — NA SULFATE-K SULFATE-MG SULF 17.5-3.13-1.6 GM/177ML PO SOLN: 1.0000 | Freq: Once | ORAL | 0 refills | Status: AC

## 2021-02-15 NOTE — Progress Notes (Signed)
Gastroenterology Pre-Procedure Review  Request Date: 03/05/21 Requesting Physician: Dr. Allen Norris  PATIENT REVIEW QUESTIONS: The patient responded to the following health history questions as indicated:    1. Are you having any GI issues? no 2. Do you have a personal history of Polyps? yes (2017 performed by Dr. Allen Norris) 3. Do you have a family history of Colon Cancer or Polyps? yes (Paternal grandmother colon cancer) 4. Diabetes Mellitus? no 5. Joint replacements in the past 12 months?no 6. Major health problems in the past 3 months?no 7. Any artificial heart valves, MVP, or defibrillator?no    MEDICATIONS & ALLERGIES:    Patient reports the following regarding taking any anticoagulation/antiplatelet therapy:   Plavix, Coumadin, Eliquis, Xarelto, Lovenox, Pradaxa, Brilinta, or Effient? no Aspirin? yes (81 mg daily)  Patient confirms/reports the following medications:  Current Outpatient Medications  Medication Sig Dispense Refill  . acetaminophen (TYLENOL) 325 MG tablet Take 650 mg by mouth every 6 (six) hours as needed for mild pain or headache.    . ASPIRIN 81 PO Take 1 tablet by mouth daily.    Marland Kitchen atorvastatin (LIPITOR) 40 MG tablet Take 1 tablet (40 mg total) by mouth at bedtime. 90 tablet 3  . Calcium Carbonate-Vitamin D (CALCIUM 500 + D PO) Take 1 tablet by mouth daily.    Marland Kitchen levothyroxine (SYNTHROID) 75 MCG tablet TAKE 1 TABLET BY MOUTH  DAILY BEFORE BREAKFAST 90 tablet 3  . Na Sulfate-K Sulfate-Mg Sulf 17.5-3.13-1.6 GM/177ML SOLN Take 1 kit by mouth once for 1 dose. 354 mL 0  . Omega-3 Fatty Acids (FISH OIL) 1000 MG CAPS Take 1,000 mg by mouth daily.    Marland Kitchen Propylene Glycol (SYSTANE BALANCE OP) Apply 1 drop to eye daily.     No current facility-administered medications for this visit.    Patient confirms/reports the following allergies:  No Known Allergies  No orders of the defined types were placed in this encounter.   AUTHORIZATION INFORMATION Primary Insurance: 1D#: Group  #:  Secondary Insurance: 1D#: Group #:  SCHEDULE INFORMATION: Date: Tuesday 03/05/21 Time: Location:[facility]

## 2021-03-01 ENCOUNTER — Other Ambulatory Visit
Admission: RE | Admit: 2021-03-01 | Discharge: 2021-03-01 | Disposition: A | Discharge status: Home / Self Care | Payer: Medicare Other | Source: Ambulatory Visit | Attending: Gastroenterology | Admitting: Gastroenterology

## 2021-03-01 ENCOUNTER — Other Ambulatory Visit: Payer: Self-pay

## 2021-03-01 DIAGNOSIS — Z01812 Encounter for preprocedural laboratory examination: Secondary | ICD-10-CM | POA: Insufficient documentation

## 2021-03-01 DIAGNOSIS — Z20822 Contact with and (suspected) exposure to covid-19: Secondary | ICD-10-CM | POA: Insufficient documentation

## 2021-03-01 LAB — SARS CORONAVIRUS 2 (TAT 6-24 HRS): SARS Coronavirus 2: NEGATIVE

## 2021-03-05 ENCOUNTER — Encounter
Admission: RE | Disposition: A | Discharge status: Home / Self Care | Payer: Self-pay | Source: Home / Self Care | Attending: Gastroenterology

## 2021-03-05 ENCOUNTER — Other Ambulatory Visit: Payer: Self-pay

## 2021-03-05 ENCOUNTER — Ambulatory Visit: Payer: Medicare Other | Admitting: Anesthesiology

## 2021-03-05 ENCOUNTER — Ambulatory Visit
Admission: RE | Admit: 2021-03-05 | Discharge: 2021-03-05 | Disposition: A | Discharge status: Home / Self Care | Payer: Medicare Other | Attending: Gastroenterology | Admitting: Gastroenterology

## 2021-03-05 ENCOUNTER — Encounter: Payer: Self-pay | Admitting: Gastroenterology

## 2021-03-05 DIAGNOSIS — Z7982 Long term (current) use of aspirin: Secondary | ICD-10-CM | POA: Diagnosis not present

## 2021-03-05 DIAGNOSIS — Z1211 Encounter for screening for malignant neoplasm of colon: Secondary | ICD-10-CM | POA: Insufficient documentation

## 2021-03-05 DIAGNOSIS — K640 First degree hemorrhoids: Secondary | ICD-10-CM | POA: Insufficient documentation

## 2021-03-05 DIAGNOSIS — Z860100 Personal history of colon polyps, unspecified: Secondary | ICD-10-CM

## 2021-03-05 DIAGNOSIS — K635 Polyp of colon: Secondary | ICD-10-CM | POA: Diagnosis not present

## 2021-03-05 DIAGNOSIS — D123 Benign neoplasm of transverse colon: Secondary | ICD-10-CM | POA: Diagnosis not present

## 2021-03-05 DIAGNOSIS — D120 Benign neoplasm of cecum: Secondary | ICD-10-CM | POA: Diagnosis not present

## 2021-03-05 DIAGNOSIS — Z79899 Other long term (current) drug therapy: Secondary | ICD-10-CM | POA: Insufficient documentation

## 2021-03-05 DIAGNOSIS — Z79890 Hormone replacement therapy (postmenopausal): Secondary | ICD-10-CM | POA: Diagnosis not present

## 2021-03-05 DIAGNOSIS — D126 Benign neoplasm of colon, unspecified: Secondary | ICD-10-CM | POA: Diagnosis not present

## 2021-03-05 HISTORY — PX: COLONOSCOPY WITH PROPOFOL: SHX5780

## 2021-03-05 SURGERY — COLONOSCOPY WITH PROPOFOL
Anesthesia: General

## 2021-03-05 MED ORDER — PROPOFOL 10 MG/ML IV BOLUS
INTRAVENOUS | Status: DC | PRN
  Administered 2021-03-05: 30 mg via INTRAVENOUS
  Administered 2021-03-05: 70 mg via INTRAVENOUS

## 2021-03-05 MED ORDER — PROPOFOL 500 MG/50ML IV EMUL
INTRAVENOUS | Status: DC | PRN
  Administered 2021-03-05: 120 ug/kg/min via INTRAVENOUS

## 2021-03-05 MED ORDER — SODIUM CHLORIDE 0.9 % IV SOLN: INTRAVENOUS | Status: DC

## 2021-03-05 MED ORDER — LIDOCAINE 2% (20 MG/ML) 5 ML SYRINGE
INTRAMUSCULAR | Status: DC | PRN
  Administered 2021-03-05: 25 mg via INTRAVENOUS

## 2021-03-05 NOTE — Transfer of Care (Signed)
Immediate Anesthesia Transfer of Care Note  Patient: Lynn Sandoval  Procedure(s) Performed: COLONOSCOPY WITH PROPOFOL (N/A )  Patient Location: Endoscopy Unit  Anesthesia Type:General  Level of Consciousness: drowsy  Airway & Oxygen Therapy: Patient Spontanous Breathing  Post-op Assessment: Report given to RN and Post -op Vital signs reviewed and stable  Post vital signs: Reviewed  Last Vitals:  Vitals Value Taken Time  BP 98/61 03/05/21 1014  Temp    Pulse 77 03/05/21 1014  Resp 14 03/05/21 1014  SpO2 98 % 03/05/21 1014  Vitals shown include unvalidated device data.  Last Pain:  Vitals:   03/05/21 1014  PainSc: 0-No pain         Complications: No complications documented.

## 2021-03-05 NOTE — H&P (Signed)
Lucilla Lame, MD [address] [place], [place] [address] Phone:[phone] Fax : [phone]  Primary Care Physician:  Delsa Grana, PA-C Primary Gastroenterologist:  Dr. Allen Norris  Pre-Procedure History & Physical: HPI:  Lynn Sandoval is a 70 y.o. female is here for an colonoscopy.   Past Medical History:  Diagnosis Date  . Appendicitis, acute 03/13/2018  . Arthritis    fingers  . Cancer ([place])    melanoma  . Colon polyps   . Dysplastic nevus 10/27/2016   Right post. shoulder. Moderate atypia, lateral margin involved.  . Family history of adverse reaction to anesthesia    son - slow to wake  . Gallstone 09/24/2017   [place] Sept 2018 1.8 cm  . H/O dysplastic nevus 02/13/2017   Followed by Dr. Brendolyn Patty  . Heart murmur    GRADE 1 DIASTOLIC MURMUR PER ECHO IN MARCH 2018  . Hyperlipidemia   . Hypothyroidism   . Melanoma ([place]) 01/04/2018   Right upper arm. MIS, lateral margin involved. Excised 02/01/2018, residual MIS margins free.  . Motion sickness    boats  . Osteopenia 02/25/2017   DEXA Feb 2018  . Personal history of colonic polyps   . Thyroid disease     Past Surgical History:  Procedure Laterality Date  . APPENDECTOMY  March 2019  . [place]  . CHOLECYSTECTOMY N/A 10/13/2017   Procedure: LAPAROSCOPIC CHOLECYSTECTOMY;  Surgeon: Jules Husbands, MD;  Location: [facility];  Service: General;  Laterality: N/A;  . COLONOSCOPY WITH PROPOFOL N/A 02/04/2016   Procedure: COLONOSCOPY WITH PROPOFOL;  Surgeon: Lucilla Lame, MD;  Location: [place];  Service: Endoscopy;  Laterality: N/A;  . EYE SURGERY  1955  . LAPAROSCOPIC APPENDECTOMY N/A 03/13/2018   Procedure: APPENDECTOMY LAPAROSCOPIC;  Surgeon: Robert Bellow, MD;  Location: [facility];  Service: General;  Laterality: N/A;  . MELANOMA EXCISION Right 02/01/2018   MIS. Right upper arm.   Marland Kitchen POLYPECTOMY  02/04/2016   Procedure: POLYPECTOMY;  Surgeon: Lucilla Lame, MD;  Location:  [place];  Service: Endoscopy;;  . SKIN SURGERY Right 01/2018   melanoma removalm right arm  . TONSILLECTOMY  1960  . TUBAL LIGATION  1985    Prior to Admission medications   Medication Sig Start Date End Date Taking? Authorizing Provider  ASPIRIN 81 PO Take 1 tablet by mouth daily.   Yes [provider]  atorvastatin (LIPITOR) 40 MG tablet Take 1 tablet (40 mg total) by mouth at bedtime. 06/25/20  Yes Delsa Grana, PA-C  Calcium Carbonate-Vitamin D (CALCIUM 500 + D PO) Take 1 tablet by mouth daily.   Yes [provider]  levothyroxine (SYNTHROID) 75 MCG tablet TAKE 1 TABLET BY MOUTH  DAILY BEFORE BREAKFAST 06/22/20  Yes Delsa Grana, PA-C  Omega-3 Fatty Acids (FISH OIL) 1000 MG CAPS Take 1,000 mg by mouth daily.   Yes [provider]  Propylene Glycol (SYSTANE BALANCE OP) Apply 1 drop to eye daily.   Yes [provider]  acetaminophen (TYLENOL) 325 MG tablet Take 650 mg by mouth every 6 (six) hours as needed for mild pain or headache.    [provider]    Allergies as of 02/15/2021  . (No Known Allergies)    Family History  Problem Relation Age of Onset  . Depression Mother   . Hearing loss Mother   . Hyperlipidemia Mother   . Hypertension Mother   . Glaucoma Mother   .  Cancer Mother        melanoma  . COPD Mother   . Heart disease Father   . Heart attack Father   . Hearing loss Father   . Diabetes Maternal Grandmother   . Heart attack Maternal Grandmother   . Cancer Paternal Grandmother        colon  . Breast cancer Neg Hx     Social History   Socioeconomic History  . Marital status: Married    Spouse name: Not on file  . Number of children: 4  . Years of education: Not on file  . Highest education level: Master's degree (e.g., MA, MS, MEng, MEd, MSW, MBA)  Occupational History  . Occupation: retired  Tobacco Use  . Smoking status: Never Smoker  . Smokeless tobacco: Never Used  Vaping Use  . Vaping Use:  Never used  Substance and Sexual Activity  . Alcohol use: Yes    Alcohol/week: 3.0 standard drinks    Types: 3 Glasses of wine per week    Comment: ocassional glass of wine  . Drug use: No  . Sexual activity: Not Currently    Birth control/protection: None  Other Topics Concern  . Not on file  Social History Narrative  . Not on file   Social Determinants of Health   Financial Resource Strain: Low Risk   . Difficulty of Paying Living Expenses: Not hard at all  Food Insecurity: No Food Insecurity  . Worried About Charity fundraiser in the Last Year: Never true  . Ran Out of Food in the Last Year: Never true  Transportation Needs: No Transportation Needs  . Lack of Transportation (Medical): No  . Lack of Transportation (Non-Medical): No  Physical Activity: Sufficiently Active  . Days of Exercise per Week: 5 days  . Minutes of Exercise per Session: 60 min  Stress: No Stress Concern Present  . Feeling of Stress : Not at all  Social Connections: Moderately Integrated  . Frequency of Communication with Friends and Family: More than three times a week  . Frequency of Social Gatherings with Friends and Family: Three times a week  . Attends Religious Services: More than 4 times per year  . Active Member of Clubs or Organizations: No  . Attends Archivist Meetings: Never  . Marital Status: Married  Human resources officer Violence: Not At Risk  . Fear of Current or Ex-Partner: No  . Emotionally Abused: No  . Physically Abused: No  . Sexually Abused: No    Review of Systems: See HPI, otherwise negative ROS  Physical Exam: BP (!) 147/74   Pulse 84   Temp (!) 97.5 F (36.4 C)   Resp 20   Ht 5\' 4"  (1.626 m)   Wt 74.8 kg   SpO2 99%   BMI 28.32 kg/m  General:   Alert,  pleasant and cooperative in NAD Head:  Normocephalic and atraumatic. Neck:  Supple; no masses or thyromegaly. Lungs:  Clear throughout to auscultation.    Heart:  Regular rate and rhythm. Abdomen:   Soft, nontender and nondistended. Normal bowel sounds, without guarding, and without rebound.   Neurologic:  Alert and  oriented x4;  grossly normal neurologically.  Impression/Plan: Lynn Sandoval is here for an colonoscopy to be performed for a history of adenomatous polyps on 01/2016   Risks, benefits, limitations, and alternatives regarding  endoscopy and colonoscopy have been reviewed with the patient.  Questions have been answered.  All parties agreeable.  Lucilla Lame, MD  03/05/2021, 9:49 AM

## 2021-03-05 NOTE — Anesthesia Postprocedure Evaluation (Signed)
Anesthesia Post Note  Patient: Lynn Sandoval  Procedure(s) Performed: COLONOSCOPY WITH PROPOFOL (N/A )  Patient location during evaluation: Endoscopy Anesthesia Type: General Level of consciousness: awake and alert Pain management: pain level controlled Vital Signs Assessment: post-procedure vital signs reviewed and stable Respiratory status: spontaneous breathing and respiratory function stable Cardiovascular status: stable Anesthetic complications: no   No complications documented.   Last Vitals:  Vitals:   03/05/21 1024 03/05/21 1034  BP: 112/66 114/63  Pulse: 74 73  Resp: 14 17  Temp:    SpO2: 99% 99%    Last Pain:  Vitals:   03/05/21 1034  TempSrc:   PainSc: 0-No pain                 Becca Bayne K

## 2021-03-05 NOTE — Op Note (Signed)
[facility] Gastroenterology Patient Name: Lynn Sandoval Procedure Date: 03/05/2021 9:55 AM MRN: [id number] Account #: [id number] Date of Birth: 09-27-1951 Admit Type: Outpatient Age: 70 Room: [facility] ENDO ROOM 4 Gender: Female Note Status: Finalized Procedure:             Colonoscopy Indications:           High risk colon cancer surveillance: Personal history                         of colonic polyps Providers:             Lucilla Lame MD, MD Medicines:             Propofol per Anesthesia Complications:         No immediate complications. Procedure:             Pre-Anesthesia Assessment:                        - Prior to the procedure, a History and Physical was                         performed, and patient medications and allergies were                         reviewed. The patient's tolerance of previous                         anesthesia was also reviewed. The risks and benefits                         of the procedure and the sedation options and risks                         were discussed with the patient. All questions were                         answered, and informed consent was obtained. Prior                         Anticoagulants: The patient has taken no previous                         anticoagulant or antiplatelet agents. ASA Grade                         Assessment: II - A patient with mild systemic disease.                         After reviewing the risks and benefits, the patient                         was deemed in satisfactory condition to undergo the                         procedure.                        After obtaining informed consent, the colonoscope was  passed under direct vision. Throughout the procedure,                         the patient's blood pressure, pulse, and oxygen                         saturations were monitored continuously. The                         Colonoscope was introduced through the anus  and                         advanced to the the cecum, identified by appendiceal                         orifice and ileocecal valve. The colonoscopy was                         performed without difficulty. The patient tolerated                         the procedure well. The quality of the bowel                         preparation was excellent. Findings:      The perianal and digital rectal examinations were normal.      A 3 mm polyp was found in the cecum. The polyp was sessile. The polyp       was removed with a cold snare. Resection and retrieval were complete.      A 3 mm polyp was found in the transverse colon. The polyp was sessile.       The polyp was removed with a cold snare. Resection and retrieval were       complete.      Non-bleeding internal hemorrhoids were found during retroflexion. The       hemorrhoids were Grade I (internal hemorrhoids that do not prolapse). Impression:            - One 3 mm polyp in the cecum, removed with a cold                         snare. Resected and retrieved.                        - One 3 mm polyp in the transverse colon, removed with                         a cold snare. Resected and retrieved.                        - Non-bleeding internal hemorrhoids. Recommendation:        - Discharge patient to home.                        - Resume previous diet.                        - Continue present medications.                        -  Await pathology results.                        - Repeat colonoscopy in 7 years for surveillance. Procedure Code(s):     --- Professional ---                        [phone], Colonoscopy, flexible; with removal of                         tumor(s), polyp(s), or other lesion(s) by snare                         technique Diagnosis Code(s):     --- Professional ---                        Z86.010, Personal history of colonic polyps                        K63.5, Polyp of colon CPT copyright 2019 American Medical  Association. All rights reserved. The codes documented in this report are preliminary and upon coder review may  be revised to meet current compliance requirements. Lucilla Lame MD, MD 03/05/2021 10:12:09 AM This report has been signed electronically. Number of Addenda: 0 Note Initiated On: 03/05/2021 9:55 AM Scope Withdrawal Time: 0 hours 7 minutes 50 seconds  Total Procedure Duration: 0 hours 12 minutes 1 second  Estimated Blood Loss:  Estimated blood loss: none.      [facility]

## 2021-03-05 NOTE — Anesthesia Preprocedure Evaluation (Signed)
Anesthesia Evaluation  Patient identified by MRN, date of birth, ID band Patient awake    Reviewed: Allergy & Precautions, NPO status , Patient's Chart, lab work & pertinent test results  History of Anesthesia Complications Negative for: history of anesthetic complications  Airway Mallampati: II       Dental   Pulmonary neg sleep apnea, neg COPD, Not current smoker,           Cardiovascular (-) hypertension(-) Past MI and (-) CHF (-) dysrhythmias + Valvular Problems/Murmurs (murmur, no tx)      Neuro/Psych neg Seizures    GI/Hepatic Neg liver ROS, neg GERD  ,  Endo/Other  neg diabetesHypothyroidism   Renal/GU negative Renal ROS     Musculoskeletal   Abdominal   Peds  Hematology   Anesthesia Other Findings   Reproductive/Obstetrics                             Anesthesia Physical Anesthesia Plan  ASA: II  Anesthesia Plan: General   Post-op Pain Management:    Induction:   PONV Risk Score and Plan: 3 and Propofol infusion and TIVA  Airway Management Planned: Nasal Cannula  Additional Equipment:   Intra-op Plan:   Post-operative Plan:   Informed Consent: I have reviewed the patients History and Physical, chart, labs and discussed the procedure including the risks, benefits and alternatives for the proposed anesthesia with the patient or authorized representative who has indicated his/her understanding and acceptance.       Plan Discussed with:   Anesthesia Plan Comments:         Anesthesia Quick Evaluation

## 2021-03-06 ENCOUNTER — Encounter: Payer: Self-pay | Admitting: Gastroenterology

## 2021-03-06 LAB — SURGICAL PATHOLOGY

## 2021-03-11 ENCOUNTER — Ambulatory Visit: Payer: Medicare Other | Admitting: Dermatology

## 2021-03-11 ENCOUNTER — Other Ambulatory Visit: Payer: Self-pay

## 2021-03-11 ENCOUNTER — Encounter: Payer: Self-pay | Admitting: Dermatology

## 2021-03-11 DIAGNOSIS — Z86006 Personal history of melanoma in-situ: Secondary | ICD-10-CM

## 2021-03-11 DIAGNOSIS — Z86018 Personal history of other benign neoplasm: Secondary | ICD-10-CM

## 2021-03-11 DIAGNOSIS — L918 Other hypertrophic disorders of the skin: Secondary | ICD-10-CM | POA: Diagnosis not present

## 2021-03-11 DIAGNOSIS — Z1283 Encounter for screening for malignant neoplasm of skin: Secondary | ICD-10-CM

## 2021-03-11 DIAGNOSIS — D2262 Melanocytic nevi of left upper limb, including shoulder: Secondary | ICD-10-CM

## 2021-03-11 DIAGNOSIS — D2272 Melanocytic nevi of left lower limb, including hip: Secondary | ICD-10-CM

## 2021-03-11 DIAGNOSIS — D225 Melanocytic nevi of trunk: Secondary | ICD-10-CM

## 2021-03-11 DIAGNOSIS — D229 Melanocytic nevi, unspecified: Secondary | ICD-10-CM

## 2021-03-11 DIAGNOSIS — L814 Other melanin hyperpigmentation: Secondary | ICD-10-CM

## 2021-03-11 DIAGNOSIS — L820 Inflamed seborrheic keratosis: Secondary | ICD-10-CM | POA: Diagnosis not present

## 2021-03-11 DIAGNOSIS — L821 Other seborrheic keratosis: Secondary | ICD-10-CM

## 2021-03-11 DIAGNOSIS — D1800 Hemangioma unspecified site: Secondary | ICD-10-CM

## 2021-03-11 DIAGNOSIS — D2261 Melanocytic nevi of right upper limb, including shoulder: Secondary | ICD-10-CM

## 2021-03-11 DIAGNOSIS — L738 Other specified follicular disorders: Secondary | ICD-10-CM | POA: Diagnosis not present

## 2021-03-11 DIAGNOSIS — D2230 Melanocytic nevi of unspecified part of face: Secondary | ICD-10-CM

## 2021-03-11 DIAGNOSIS — L578 Other skin changes due to chronic exposure to nonionizing radiation: Secondary | ICD-10-CM

## 2021-03-11 NOTE — Patient Instructions (Addendum)
Melanoma ABCDEs  Melanoma is the most dangerous type of skin cancer, and is the leading cause of death from skin disease.  You are more likely to develop melanoma if you:  Have light-colored skin, light-colored eyes, or red or blond hair  Spend a lot of time in the sun  Tan regularly, either outdoors or in a tanning bed  Have had blistering sunburns, especially during childhood  Have a close family member who has had a melanoma  Have atypical moles or large birthmarks  Early detection of melanoma is key since treatment is typically straightforward and cure rates are extremely high if we catch it early.   The first sign of melanoma is often a change in a mole or a new dark spot.  The ABCDE system is a way of remembering the signs of melanoma.  A for asymmetry:  The two halves do not match. B for border:  The edges of the growth are irregular. C for color:  A mixture of colors are present instead of an even brown color. D for diameter:  Melanomas are usually (but not always) greater than 6mm - the size of a pencil eraser. E for evolution:  The spot keeps changing in size, shape, and color.  Please check your skin once per month between visits. You can use a small mirror in front and a large mirror behind you to keep an eye on the back side or your body.   If you see any new or changing lesions before your next follow-up, please call to schedule a visit.  Please continue daily skin protection including broad spectrum sunscreen SPF 30+ to sun-exposed areas, reapplying every 2 hours as needed when you're outdoors.   Staying in the shade or wearing long sleeves, sun glasses (UVA+UVB protection) and wide brim hats (4-inch brim around the entire circumference of the hat) are also recommended for sun protection.   Cryotherapy Aftercare  . Wash gently with soap and water everyday.   . Apply Vaseline and Band-Aid daily until healed.   

## 2021-03-11 NOTE — Progress Notes (Signed)
Follow-Up Visit   Subjective  Lynn Sandoval is a 70 y.o. female who presents for the following: tbse (Patient here today for tbse. She reports no new areas of concern. Patient has history of melanoma on right upper arm. She reports no new areas of concern today. /).  She has some itchy spots on her mid back.  Patient here for full body skin exam and skin cancer screening.  The following portions of the chart were reviewed this encounter and updated as appropriate:        Objective  Well appearing patient in no apparent distress; mood and affect are within normal limits.  A full examination was performed including scalp, head, eyes, ears, nose, lips, neck, chest, axillae, abdomen, back, buttocks, bilateral upper extremities, bilateral lower extremities, hands, feet, fingers, toes, fingernails, and toenails. All findings within normal limits unless otherwise noted below.  Objective  spinal mid back x 9 (9): Erythematous keratotic or waxy stuck-on papule or plaque.   Objective  sacrum/spinal lower back, left posterior thigh, left shoulder ,right mid back: Left shoulder - 6 x 7 mm flesh/brown speckled papule  Sacrum/spinal lower back - 2.5 mm medium dark brown macule  Left posterior thigh - 2.0 mm medium brown macule Right mid back - 7 x 6 mm brown speckled flesh papule   All stable when compared to previous visit  Objective  bilateral Inframammary Fold: Fleshy, skin-colored pedunculated papules.    Assessment & Plan  Inflamed seborrheic keratosis (9) spinal mid back x 9  Prior to procedure, discussed risks of blister formation, small wound, skin dyspigmentation, or rare scar following cryotherapy.    Destruction of lesion - spinal mid back x 9  Destruction method: cryotherapy   Informed consent: discussed and consent obtained   Lesion destroyed using liquid nitrogen: Yes   Region frozen until ice ball extended beyond lesion: Yes   Outcome: patient tolerated  procedure well with no complications   Post-procedure details: wound care instructions given    Nevus sacrum/spinal lower back, left posterior thigh, left shoulder ,right mid back  Benign-appearing.  Stable. Observation.  Call clinic for new or changing lesions.  Recommend daily use of broad spectrum spf 30+ sunscreen to sun-exposed areas.    Acrochordon bilateral Inframammary Fold  Benign, observe.     Lentigines Arms and face  - Scattered tan macules - Due to sun exposure - Benign-appering, observe - Recommend daily broad spectrum sunscreen SPF 30+ to sun-exposed areas, reapply every 2 hours as needed. - Call for any changes  Sebaceous Hyperplasia Face , left upper forehead 4 mm pink flesh papule , R temple  - Small yellow papules with a central dell - Benign - Observe  Seborrheic Keratoses Arms, back, face  - Stuck-on, waxy, tan-brown papules and plaques  - Discussed benign etiology and prognosis. - Observe - Call for any changes  Melanocytic Nevi Arms and back, face - Tan-brown and/or pink-flesh-colored symmetric macules and papules - Benign appearing on exam today - Observation - Call clinic for new or changing moles - Recommend daily use of broad spectrum spf 30+ sunscreen to sun-exposed areas.   Hemangiomas Chest , abdomen , right lower leg - Red papules - Discussed benign nature - Observe - Call for any changes  Actinic Damage Chest  - Chronic, secondary to cumulative UV/sun exposure - diffuse scaly erythematous macules with underlying dyspigmentation - Recommend daily broad spectrum sunscreen SPF 30+ to sun-exposed areas, reapply every 2 hours as needed.  -  Call for new or changing lesions.  History of Melanoma in Situ Right upper arm  - No evidence of recurrence today - Recommend regular full body skin exams - Recommend daily broad spectrum sunscreen SPF 30+ to sun-exposed areas, reapply every 2 hours as needed.  - Call if any new or changing  lesions are noted between office visits  History of Dysplastic Nevi Right posterior shoulder - No evidence of recurrence today - Recommend regular full body skin exams - Recommend daily broad spectrum sunscreen SPF 30+ to sun-exposed areas, reapply every 2 hours as needed.  - Call if any new or changing lesions are noted between office visits     Skin cancer screening performed today.  Return in about 6 months (around 09/11/2021) for tbse, h/o melanoma IS.  I, Ruthell Rummage, CMA, am acting as scribe for Brendolyn Patty, MD.  Documentation: I have reviewed the above documentation for accuracy and completeness, and I agree with the above.  Brendolyn Patty MD

## 2021-03-12 ENCOUNTER — Other Ambulatory Visit: Payer: Self-pay | Admitting: Family Medicine

## 2021-03-12 DIAGNOSIS — Z1231 Encounter for screening mammogram for malignant neoplasm of breast: Secondary | ICD-10-CM

## 2021-04-09 ENCOUNTER — Other Ambulatory Visit: Payer: Self-pay | Admitting: Family Medicine

## 2021-04-09 DIAGNOSIS — E785 Hyperlipidemia, unspecified: Secondary | ICD-10-CM

## 2021-04-09 DIAGNOSIS — E039 Hypothyroidism, unspecified: Secondary | ICD-10-CM

## 2021-04-25 ENCOUNTER — Ambulatory Visit: Payer: Medicare Other

## 2021-04-26 ENCOUNTER — Other Ambulatory Visit: Payer: Self-pay

## 2021-04-26 ENCOUNTER — Ambulatory Visit
Admission: RE | Admit: 2021-04-26 | Discharge: 2021-04-26 | Disposition: A | Discharge status: Home / Self Care | Payer: Medicare Other | Source: Ambulatory Visit | Attending: Family Medicine | Admitting: Family Medicine

## 2021-04-26 DIAGNOSIS — Z1231 Encounter for screening mammogram for malignant neoplasm of breast: Secondary | ICD-10-CM | POA: Insufficient documentation

## 2021-06-20 ENCOUNTER — Encounter: Payer: Medicare Other | Admitting: Family Medicine

## 2021-06-23 ENCOUNTER — Telehealth: Payer: Self-pay | Admitting: Family Medicine

## 2021-06-23 DIAGNOSIS — E039 Hypothyroidism, unspecified: Secondary | ICD-10-CM

## 2021-06-23 DIAGNOSIS — E785 Hyperlipidemia, unspecified: Secondary | ICD-10-CM

## 2021-06-24 NOTE — Telephone Encounter (Signed)
Spoke with patient and she is in the process of changing providers. She will let her new pcp take care of her refills. I have taken [facility] off her provider list.

## 2021-06-24 NOTE — Telephone Encounter (Signed)
Pt needs appt 90 days sent to [facility].  No other refills without being seen

## 2021-09-10 ENCOUNTER — Other Ambulatory Visit: Payer: Self-pay

## 2021-09-10 ENCOUNTER — Ambulatory Visit: Payer: Medicare Other | Admitting: Dermatology

## 2021-09-10 DIAGNOSIS — L814 Other melanin hyperpigmentation: Secondary | ICD-10-CM

## 2021-09-10 DIAGNOSIS — D229 Melanocytic nevi, unspecified: Secondary | ICD-10-CM

## 2021-09-10 DIAGNOSIS — L821 Other seborrheic keratosis: Secondary | ICD-10-CM | POA: Diagnosis not present

## 2021-09-10 DIAGNOSIS — Z86006 Personal history of melanoma in-situ: Secondary | ICD-10-CM | POA: Diagnosis not present

## 2021-09-10 DIAGNOSIS — L738 Other specified follicular disorders: Secondary | ICD-10-CM

## 2021-09-10 DIAGNOSIS — Z1283 Encounter for screening for malignant neoplasm of skin: Secondary | ICD-10-CM | POA: Diagnosis not present

## 2021-09-10 DIAGNOSIS — D692 Other nonthrombocytopenic purpura: Secondary | ICD-10-CM

## 2021-09-10 DIAGNOSIS — L578 Other skin changes due to chronic exposure to nonionizing radiation: Secondary | ICD-10-CM

## 2021-09-10 DIAGNOSIS — D1800 Hemangioma unspecified site: Secondary | ICD-10-CM

## 2021-09-10 DIAGNOSIS — Z86018 Personal history of other benign neoplasm: Secondary | ICD-10-CM

## 2021-09-10 NOTE — Patient Instructions (Signed)

## 2021-09-10 NOTE — Progress Notes (Signed)
Follow-Up Visit   Subjective  Lynn Sandoval is a 70 y.o. female who presents for the following: Follow-up (Patient presents for 6 month follow-up TBSE. She has a history of melanoma in situ of the right upper arm (02/01/18) and history of dysplastic nevus of the right posterior shoulder (mod, 2017)).   The following portions of the chart were reviewed this encounter and updated as appropriate:       Review of Systems:  No other skin or systemic complaints except as noted in HPI or Assessment and Plan.  Objective  Well appearing patient in no apparent distress; mood and affect are within normal limits.  A full examination was performed including scalp, head, eyes, ears, nose, lips, neck, chest, axillae, abdomen, back, buttocks, bilateral upper extremities, bilateral lower extremities, hands, feet, fingers, toes, fingernails, and toenails. All findings within normal limits unless otherwise noted below.  trunk, extremities Left shoulder - 6 x 7 mm flesh/brown speckled papule  Sacrum/spinal lower back - 2.5 mm medium dark brown macule  Left posterior thigh - 2.0 mm medium dark brown macule Right mid back - 7 x 6 mm brown speckled flesh papule   Right Upper Arm Well healed scar with no evidence of recurrence.   Right Posterior Shoulder 4.27m speckled brown flat papule   Assessment & Plan  Nevus trunk, extremities  Benign-appearing.  Observation.  Call clinic for new or changing moles.  Recommend daily use of broad spectrum spf 30+ sunscreen to sun-exposed areas.   History of melanoma in situ Right Upper Arm  Clear. Observe for recurrence. Call clinic for new or changing lesions.  Recommend regular skin exams, daily broad-spectrum spf 30+ sunscreen use, and photoprotection.    Seborrheic keratosis Right Posterior Shoulder  Reassured benign age-related growth.  Recommend observation.  Discussed cryotherapy if spot(s) become irritated or inflamed.  Skin cancer screening  performed today.  Actinic Damage - chronic, secondary to cumulative UV radiation exposure/sun exposure over time - diffuse scaly erythematous macules with underlying dyspigmentation - Recommend daily broad spectrum sunscreen SPF 30+ to sun-exposed areas, reapply every 2 hours as needed.  - Recommend staying in the shade or wearing long sleeves, sun glasses (UVA+UVB protection) and wide brim hats (4-inch brim around the entire circumference of the hat). - Call for new or changing lesions.  Purpura - Chronic; persistent and recurrent.  Treatable, but not curable. - Violaceous macules and patches - Benign - Related to trauma, age, sun damage and/or use of blood thinners, chronic use of topical and/or oral steroids - Observe - Can use OTC arnica containing moisturizer such as Dermend Bruise Formula if desired - Call for worsening or other concerns  Hemangiomas - Red papules - Discussed benign nature - Observe - Call for any changes  Lentigines - Scattered tan macules - Due to sun exposure - Benign-appering, observe - Recommend daily broad spectrum sunscreen SPF 30+ to sun-exposed areas, reapply every 2 hours as needed. - Call for any changes  Sebaceous Hyperplasia - Small yellow papules with a central dell - Benign - Observe  Seborrheic Keratoses - Stuck-on, waxy, tan-brown papules and/or plaques  - Benign-appearing - Discussed benign etiology and prognosis. - Observe - Call for any changes  History of Dysplastic Nevi - No evidence of recurrence today - Recommend regular full body skin exams - Recommend daily broad spectrum sunscreen SPF 30+ to sun-exposed areas, reapply every 2 hours as needed.  - Call if any new or changing lesions are noted between office  visits  Return in about 6 months (around 03/10/2022) for TBSE, hx melanoma in situ.  IJamesetta Orleans, CMA, am acting as scribe for Brendolyn Patty, MD .  Documentation: I have reviewed the above documentation for  accuracy and completeness, and I agree with the above.  Brendolyn Patty MD

## 2021-10-13 ENCOUNTER — Other Ambulatory Visit: Payer: Self-pay | Admitting: Family Medicine

## 2021-10-13 DIAGNOSIS — E039 Hypothyroidism, unspecified: Secondary | ICD-10-CM

## 2021-10-13 DIAGNOSIS — E785 Hyperlipidemia, unspecified: Secondary | ICD-10-CM

## 2021-10-13 NOTE — Telephone Encounter (Signed)
effective 06/23/21 no longer pt of Delsa Grana PA-C

## 2022-03-25 ENCOUNTER — Ambulatory Visit: Payer: Medicare Other | Admitting: Dermatology

## 2022-03-25 ENCOUNTER — Other Ambulatory Visit: Payer: Self-pay

## 2022-03-25 DIAGNOSIS — Z1283 Encounter for screening for malignant neoplasm of skin: Secondary | ICD-10-CM | POA: Diagnosis not present

## 2022-03-25 DIAGNOSIS — D1800 Hemangioma unspecified site: Secondary | ICD-10-CM

## 2022-03-25 DIAGNOSIS — D229 Melanocytic nevi, unspecified: Secondary | ICD-10-CM | POA: Diagnosis not present

## 2022-03-25 DIAGNOSIS — Z86018 Personal history of other benign neoplasm: Secondary | ICD-10-CM

## 2022-03-25 DIAGNOSIS — L578 Other skin changes due to chronic exposure to nonionizing radiation: Secondary | ICD-10-CM

## 2022-03-25 DIAGNOSIS — Z86006 Personal history of melanoma in-situ: Secondary | ICD-10-CM

## 2022-03-25 DIAGNOSIS — L821 Other seborrheic keratosis: Secondary | ICD-10-CM

## 2022-03-25 DIAGNOSIS — D692 Other nonthrombocytopenic purpura: Secondary | ICD-10-CM

## 2022-03-25 DIAGNOSIS — D225 Melanocytic nevi of trunk: Secondary | ICD-10-CM

## 2022-03-25 DIAGNOSIS — L814 Other melanin hyperpigmentation: Secondary | ICD-10-CM

## 2022-03-25 DIAGNOSIS — L738 Other specified follicular disorders: Secondary | ICD-10-CM

## 2022-03-25 NOTE — Patient Instructions (Addendum)
Melanoma ABCDEs ? ?Melanoma is the most dangerous type of skin cancer, and is the leading cause of death from skin disease.  You are more likely to develop melanoma if you: ?Have light-colored skin, light-colored eyes, or red or blond hair ?Spend a lot of time in the sun ?Tan regularly, either outdoors or in a tanning bed ?Have had blistering sunburns, especially during childhood ?Have a close family member who has had a melanoma ?Have atypical moles or large birthmarks ? ?Early detection of melanoma is key since treatment is typically straightforward and cure rates are extremely high if we catch it early.  ? ?The first sign of melanoma is often a change in a mole or a new dark spot.  The ABCDE system is a way of remembering the signs of melanoma. ? ?A for asymmetry:  The two halves do not match. ?B for border:  The edges of the growth are irregular. ?C for color:  A mixture of colors are present instead of an even brown color. ?D for diameter:  Melanomas are usually (but not always) greater than 6mm - the size of a pencil eraser. ?E for evolution:  The spot keeps changing in size, shape, and color. ? ?Please check your skin once per month between visits. You can use a small mirror in front and a large mirror behind you to keep an eye on the back side or your body.  ? ?If you see any new or changing lesions before your next follow-up, please call to schedule a visit. ? ?Please continue daily skin protection including broad spectrum sunscreen SPF 30+ to sun-exposed areas, reapplying every 2 hours as needed when you're outdoors.  ? ?Staying in the shade or wearing long sleeves, sun glasses (UVA+UVB protection) and wide brim hats (4-inch brim around the entire circumference of the hat) are also recommended for sun protection.   ? ? ?If You Need Anything After Your Visit ? ?If you have any questions or concerns for your doctor, please call our main line at [phone] and press option 4 to reach your doctor's medical  assistant. If no one answers, please leave a voicemail as directed and we will return your call as soon as possible. Messages left after 4 pm will be answered the following business day.  ? ?You may also send us a message via MyChart. We typically respond to MyChart messages within 1-2 business days. ? ?For prescription refills, please ask your pharmacy to contact our office. Our fax number is [phone]. ? ?If you have an urgent issue when the clinic is closed that cannot wait until the next business day, you can page your doctor at the number below.   ? ?Please note that while we do our best to be available for urgent issues outside of office hours, we are not available 24/7.  ? ?If you have an urgent issue and are unable to reach us, you may choose to seek medical care at your doctor's office, retail clinic, urgent care center, or emergency room. ? ?If you have a medical emergency, please immediately call 911 or go to the emergency department. ? ?Pager Numbers ? ?- Dr. Kowalski: [phone] ? ?- Dr. Moye: [phone] ? ?- Dr. Stewart: [phone] ? ?In the event of inclement weather, please call our main line at [phone] for an update on the status of any delays or closures. ? ?Dermatology Medication Tips: ?Please keep the boxes that topical medications come in in order to help keep track of the instructions   about where and how to use these. Pharmacies typically print the medication instructions only on the boxes and not directly on the medication tubes.  ? ?If your medication is too expensive, please contact our office at [phone] option 4 or send us a message through MyChart.  ? ?We are unable to tell what your co-pay for medications will be in advance as this is different depending on your insurance coverage. However, we may be able to find a substitute medication at lower cost or fill out paperwork to get insurance to cover a needed medication.  ? ?If a prior authorization is required to get your  medication covered by your insurance company, please allow us 1-2 business days to complete this process. ? ?Drug prices often vary depending on where the prescription is filled and some pharmacies may offer cheaper prices. ? ?The website www.goodrx.com contains coupons for medications through different pharmacies. The prices here do not account for what the cost may be with help from insurance (it may be cheaper with your insurance), but the website can give you the price if you did not use any insurance.  ?- You can print the associated coupon and take it with your prescription to the pharmacy.  ?- You may also stop by our office during regular business hours and pick up a GoodRx coupon card.  ?- If you need your prescription sent electronically to a different pharmacy, notify our office through [facility] MyChart or by phone at [phone] option 4. ? ? ? ? ?Si Usted Necesita Algo Despu?s de Su Visita ? ?Tambi?n puede enviarnos un mensaje a trav?s de MyChart. Por lo general respondemos a los mensajes de MyChart en el transcurso de 1 a 2 d?as h?biles. ? ?Para renovar recetas, por favor pida a su farmacia que se ponga en contacto con nuestra oficina. Nuestro n?mero de fax es el [phone]. ? ?Si tiene un asunto urgente cuando la cl?nica est? cerrada y que no puede esperar hasta el siguiente d?a h?bil, puede llamar/localizar a su doctor(a) al n?mero que aparece a continuaci?n.  ? ?Por favor, tenga en cuenta que aunque hacemos todo lo posible para estar disponibles para asuntos urgentes fuera del horario de oficina, no estamos disponibles las 24 horas del d?a, los 7 d?as de la semana.  ? ?Si tiene un problema urgente y no puede comunicarse con nosotros, puede optar por buscar atenci?n m?dica  en el consultorio de su doctor(a), en una cl?nica privada, en un centro de atenci?n urgente o en una sala de emergencias. ? ?Si tiene una emergencia m?dica, por favor llame inmediatamente al 911 o vaya a la sala de  emergencias. ? ?N?meros de b?per ? ?- Dr. Kowalski: [phone] ? ?- Dra. Moye: [phone] ? ?- Dra. Stewart: [phone] ? ?En caso de inclemencias del tiempo, por favor llame a nuestra l?nea principal al [phone] para una actualizaci?n sobre el estado de cualquier retraso o cierre. ? ?Consejos para la medicaci?n en dermatolog?a: ?Por favor, guarde las cajas en las que vienen los medicamentos de uso t?pico para ayudarle a seguir las instrucciones sobre d?nde y c?mo usarlos. Las farmacias generalmente imprimen las instrucciones del medicamento s?lo en las cajas y no directamente en los tubos del medicamento.  ? ?Si su medicamento es muy caro, por favor, p?ngase en contacto con nuestra oficina llamando al [phone] y presione la opci?n 4 o env?enos un mensaje a trav?s de MyChart.  ? ?No podemos decirle cu?l ser? su copago por los medicamentos por adelantado ya que   esto es diferente dependiendo de la cobertura de su seguro. Sin embargo, es posible que podamos encontrar un medicamento sustituto a menor costo o llenar un formulario para que el seguro cubra el medicamento que se considera necesario.  ? ?Si se requiere una autorizaci?n previa para que su compa??a de seguros cubra su medicamento, por favor perm?tanos de 1 a 2 d?as h?biles para completar este proceso. ? ?Los precios de los medicamentos var?an con frecuencia dependiendo del lugar de d?nde se surte la receta y alguna farmacias pueden ofrecer precios m?s baratos. ? ?El sitio web www.goodrx.com tiene cupones para medicamentos de diferentes farmacias. Los precios aqu? no tienen en cuenta lo que podr?a costar con la ayuda del seguro (puede ser m?s barato con su seguro), pero el sitio web puede darle el precio si no utiliz? ning?n seguro.  ?- Puede imprimir el cup?n correspondiente y llevarlo con su receta a la farmacia.  ?- Tambi?n puede pasar por nuestra oficina durante el horario de atenci?n regular y recoger una tarjeta de cupones de GoodRx.  ?- Si  necesita que su receta se env?e electr?nicamente a una farmacia diferente, informe a nuestra oficina a trav?s de MyChart de [facility] o por tel?fono llamando al [phone] y presione la opci?n 4. ? ?

## 2022-03-25 NOTE — Progress Notes (Signed)
? ?Follow-Up Visit ?  ?Subjective  ?Lynn Sandoval is a 71 y.o. female who presents for the following: Follow-up. ? ?The patient presents for 6 month follow-up Total-Body Skin Exam (TBSE) for skin cancer screening and mole check.  The patient has spots, moles and lesions to be evaluated, some may be new or changing. She has a history of  melanoma in situ of the right upper arm and history of dysplastic nevus of the right posterior shoulder. ? ?The following portions of the chart were reviewed this encounter and updated as appropriate:  ?  ?  ? ?Review of Systems:  No other skin or systemic complaints except as noted in HPI or Assessment and Plan. ? ?Objective  ?Well appearing patient in no apparent distress; mood and affect are within normal limits. ? ?A full examination was performed including scalp, head, eyes, ears, nose, lips, neck, chest, axillae, abdomen, back, buttocks, bilateral upper extremities, bilateral lower extremities, hands, feet, fingers, toes, fingernails, and toenails. All findings within normal limits unless otherwise noted below. ? ?Right Upper Arm ?Well healed scar with no evidence of recurrence.  ? ?trunk; extremities ?Left shoulder - 6 x 7 mm flesh/brown speckled papule  ? ?Sacrum/spinal lower back - 2.5 mm medium dark brown macule  ? ?Left posterior thigh - 2.0 mm medium dark brown macule ? ?Right mid back - 7 x 6 mm brown speckled flesh papule  ? ?Right nasal ala - 5 mm flesh white papule  ? ?Right Posterior Shoulder ?4.29m speckled brown flat papule ? ? ? ?Assessment & Plan  ?Skin cancer screening performed today. ? ?Actinic Damage ?- chronic, secondary to cumulative UV radiation exposure/sun exposure over time ?- diffuse scaly erythematous macules with underlying dyspigmentation ?- Recommend daily broad spectrum sunscreen SPF 30+ to sun-exposed areas, reapply every 2 hours as needed.  ?- Recommend staying in the shade or wearing long sleeves, sun glasses (UVA+UVB protection) and wide  brim hats (4-inch brim around the entire circumference of the hat). ?- Call for new or changing lesions. ? ?Lentigines ?- Scattered tan macules ?- Due to sun exposure ?- Benign-appering, observe ?- Recommend daily broad spectrum sunscreen SPF 30+ to sun-exposed areas, reapply every 2 hours as needed. ?- Call for any changes ? ?Hemangiomas ?- Red papules ?- Discussed benign nature ?- Observe ?- Call for any changes ? ?Sebaceous Hyperplasia ?- Small yellow papules with a central dell ?- Benign ?- Observe ? ?Purpura - Chronic; persistent and recurrent.  Treatable, but not curable. ?- Violaceous macules and patches ?- Benign ?- Related to trauma, age, sun damage and/or use of blood thinners, chronic use of topical and/or oral steroids ?- Observe ?- Can use OTC arnica containing moisturizer such as Dermend Bruise Formula if desired ?- Call for worsening or other concerns ? ?Seborrheic Keratoses ?- Stuck-on, waxy, tan-brown papules and/or plaques  ?- Benign-appearing ?- Discussed benign etiology and prognosis. ?- Observe ?- Call for any changes ? ?History of Dysplastic Nevus ?- No evidence of recurrence today of the right posterior shoulder ?- Recommend regular full body skin exams ?- Recommend daily broad spectrum sunscreen SPF 30+ to sun-exposed areas, reapply every 2 hours as needed.  ?- Call if any new or changing lesions are noted between office visits ? ?History of melanoma in situ ?Right Upper Arm ? ?Excised 01/2018. Clear. Observe for recurrence. Call clinic for new or changing lesions.  Recommend regular skin exams, daily broad-spectrum spf 30+ sunscreen use, and photoprotection.   ? ?Nevus ?trunk; extremities ? ?  Benign-appearing.  Stable. Observation.  Call clinic for new or changing moles.  Recommend daily use of broad spectrum spf 30+ sunscreen to sun-exposed areas.  ? ?Seborrheic keratosis ?Right Posterior Shoulder ? ?Reassured benign age-related growth.  Recommend observation.  Discussed cryotherapy if  spot(s) become irritated or inflamed. ? ? ?Return in about 6 months (around 09/25/2022) for TBSE, Hx melanoma. ? ?I, Jamesetta Orleans, CMA, am acting as scribe for Brendolyn Patty, MD . ?Documentation: I have reviewed the above documentation for accuracy and completeness, and I agree with the above. ? ?Brendolyn Patty MD  ? ? ?

## 2022-04-16 ENCOUNTER — Other Ambulatory Visit: Payer: Self-pay | Admitting: Family Medicine

## 2022-04-16 DIAGNOSIS — Z1231 Encounter for screening mammogram for malignant neoplasm of breast: Secondary | ICD-10-CM

## 2022-05-20 ENCOUNTER — Ambulatory Visit
Admission: RE | Admit: 2022-05-20 | Discharge: 2022-05-20 | Disposition: A | Discharge status: Home / Self Care | Payer: Medicare Other | Source: Ambulatory Visit | Attending: Family Medicine | Admitting: Family Medicine

## 2022-05-20 DIAGNOSIS — Z1231 Encounter for screening mammogram for malignant neoplasm of breast: Secondary | ICD-10-CM | POA: Diagnosis present

## 2022-10-20 ENCOUNTER — Ambulatory Visit: Payer: Medicare Other | Admitting: Dermatology

## 2022-10-20 DIAGNOSIS — Z1283 Encounter for screening for malignant neoplasm of skin: Secondary | ICD-10-CM

## 2022-10-20 DIAGNOSIS — L578 Other skin changes due to chronic exposure to nonionizing radiation: Secondary | ICD-10-CM

## 2022-10-20 DIAGNOSIS — L820 Inflamed seborrheic keratosis: Secondary | ICD-10-CM | POA: Diagnosis not present

## 2022-10-20 DIAGNOSIS — Z86018 Personal history of other benign neoplasm: Secondary | ICD-10-CM

## 2022-10-20 DIAGNOSIS — D2239 Melanocytic nevi of other parts of face: Secondary | ICD-10-CM

## 2022-10-20 DIAGNOSIS — D2261 Melanocytic nevi of right upper limb, including shoulder: Secondary | ICD-10-CM

## 2022-10-20 DIAGNOSIS — D225 Melanocytic nevi of trunk: Secondary | ICD-10-CM

## 2022-10-20 DIAGNOSIS — Z86006 Personal history of melanoma in-situ: Secondary | ICD-10-CM | POA: Diagnosis not present

## 2022-10-20 DIAGNOSIS — D229 Melanocytic nevi, unspecified: Secondary | ICD-10-CM

## 2022-10-20 DIAGNOSIS — L821 Other seborrheic keratosis: Secondary | ICD-10-CM

## 2022-10-20 DIAGNOSIS — L814 Other melanin hyperpigmentation: Secondary | ICD-10-CM

## 2022-10-20 DIAGNOSIS — L738 Other specified follicular disorders: Secondary | ICD-10-CM

## 2022-10-20 DIAGNOSIS — D2272 Melanocytic nevi of left lower limb, including hip: Secondary | ICD-10-CM

## 2022-10-20 NOTE — Patient Instructions (Addendum)
Cryotherapy Aftercare  Wash gently with soap and water everyday.   Apply Vaseline and Band-Aid daily until healed.     Due to recent changes in healthcare laws, you may see results of your pathology and/or laboratory studies on MyChart before the doctors have had a chance to review them. We understand that in some cases there may be results that are confusing or concerning to you. Please understand that not all results are received at the same time and often the doctors may need to interpret multiple results in order to provide you with the best plan of care or course of treatment. Therefore, we ask that you please give us 2 business days to thoroughly review all your results before contacting the office for clarification. Should we see a critical lab result, you will be contacted sooner.   If You Need Anything After Your Visit  If you have any questions or concerns for your doctor, please call our main line at [phone] and press option 4 to reach your doctor's medical assistant. If no one answers, please leave a voicemail as directed and we will return your call as soon as possible. Messages left after 4 pm will be answered the following business day.   You may also send us a message via MyChart. We typically respond to MyChart messages within 1-2 business days.  For prescription refills, please ask your pharmacy to contact our office. Our fax number is [phone].  If you have an urgent issue when the clinic is closed that cannot wait until the next business day, you can page your doctor at the number below.    Please note that while we do our best to be available for urgent issues outside of office hours, we are not available 24/7.   If you have an urgent issue and are unable to reach us, you may choose to seek medical care at your doctor's office, retail clinic, urgent care center, or emergency room.  If you have a medical emergency, please immediately call 911 or go to the  emergency department.  Pager Numbers  - Dr. Kowalski: [phone]  - Dr. Moye: [phone]  - Dr. Stewart: [phone]  In the event of inclement weather, please call our main line at [phone] for an update on the status of any delays or closures.  Dermatology Medication Tips: Please keep the boxes that topical medications come in in order to help keep track of the instructions about where and how to use these. Pharmacies typically print the medication instructions only on the boxes and not directly on the medication tubes.   If your medication is too expensive, please contact our office at [phone] option 4 or send us a message through MyChart.   We are unable to tell what your co-pay for medications will be in advance as this is different depending on your insurance coverage. However, we may be able to find a substitute medication at lower cost or fill out paperwork to get insurance to cover a needed medication.   If a prior authorization is required to get your medication covered by your insurance company, please allow us 1-2 business days to complete this process.  Drug prices often vary depending on where the prescription is filled and some pharmacies may offer cheaper prices.  The website www.goodrx.com contains coupons for medications through different pharmacies. The prices here do not account for what the cost may be with help from insurance (it may be cheaper with your insurance), but the website can   give you the price if you did not use any insurance.  - You can print the associated coupon and take it with your prescription to the pharmacy.  - You may also stop by our office during regular business hours and pick up a GoodRx coupon card.  - If you need your prescription sent electronically to a different pharmacy, notify our office through [facility] MyChart or by phone at [phone] option 4.     Si Usted Necesita Algo Despus de Su Visita  Tambin puede  enviarnos un mensaje a travs de MyChart. Por lo general respondemos a los mensajes de MyChart en el transcurso de 1 a 2 das hbiles.  Para renovar recetas, por favor pida a su farmacia que se ponga en contacto con nuestra oficina. Nuestro nmero de fax es el [phone].  Si tiene un asunto urgente cuando la clnica est cerrada y que no puede esperar hasta el siguiente da hbil, puede llamar/localizar a su doctor(a) al nmero que aparece a continuacin.   Por favor, tenga en cuenta que aunque hacemos todo lo posible para estar disponibles para asuntos urgentes fuera del horario de oficina, no estamos disponibles las 24 horas del da, los 7 das de la semana.   Si tiene un problema urgente y no puede comunicarse con nosotros, puede optar por buscar atencin mdica  en el consultorio de su doctor(a), en una clnica privada, en un centro de atencin urgente o en una sala de emergencias.  Si tiene una emergencia mdica, por favor llame inmediatamente al 911 o vaya a la sala de emergencias.  Nmeros de bper  - Dr. Kowalski: [phone]  - Dra. Moye: [phone]  - Dra. Stewart: [phone]  En caso de inclemencias del tiempo, por favor llame a nuestra lnea principal al [phone] para una actualizacin sobre el estado de cualquier retraso o cierre.  Consejos para la medicacin en dermatologa: Por favor, guarde las cajas en las que vienen los medicamentos de uso tpico para ayudarle a seguir las instrucciones sobre dnde y cmo usarlos. Las farmacias generalmente imprimen las instrucciones del medicamento slo en las cajas y no directamente en los tubos del medicamento.   Si su medicamento es muy caro, por favor, pngase en contacto con nuestra oficina llamando al [phone] y presione la opcin 4 o envenos un mensaje a travs de MyChart.   No podemos decirle cul ser su copago por los medicamentos por adelantado ya que esto es diferente dependiendo de la cobertura de su seguro.  Sin embargo, es posible que podamos encontrar un medicamento sustituto a menor costo o llenar un formulario para que el seguro cubra el medicamento que se considera necesario.   Si se requiere una autorizacin previa para que su compaa de seguros cubra su medicamento, por favor permtanos de 1 a 2 das hbiles para completar este proceso.  Los precios de los medicamentos varan con frecuencia dependiendo del lugar de dnde se surte la receta y alguna farmacias pueden ofrecer precios ms baratos.  El sitio web www.goodrx.com tiene cupones para medicamentos de diferentes farmacias. Los precios aqu no tienen en cuenta lo que podra costar con la ayuda del seguro (puede ser ms barato con su seguro), pero el sitio web puede darle el precio si no utiliz ningn seguro.  - Puede imprimir el cupn correspondiente y llevarlo con su receta a la farmacia.  - Tambin puede pasar por nuestra oficina durante el horario de atencin regular y recoger una tarjeta de cupones de GoodRx.  -   Si necesita que su receta se enve electrnicamente a una farmacia diferente, informe a nuestra oficina a travs de MyChart de [facility] o por telfono llamando al [phone] y presione la opcin 4.  

## 2022-10-20 NOTE — Progress Notes (Signed)
Follow-Up Visit   Subjective  Lynn Sandoval is a 71 y.o. female who presents for the following: Total body skin exam (Hx of Melanoma IS R upper arm, hx of Dysplastic R post shoulder).  She has a growing spot on L upper arm that gets irritated.  Also an irritated spot on neck.  The patient presents for Total-Body Skin Exam (TBSE) for skin cancer screening and mole check.  The patient has spots, moles and lesions to be evaluated, some may be new or changing and the patient has concerns that these could be cancer.   The following portions of the chart were reviewed this encounter and updated as appropriate:       Review of Systems:  No other skin or systemic complaints except as noted in HPI or Assessment and Plan.  Objective  Well appearing patient in no apparent distress; mood and affect are within normal limits.  A full examination was performed including scalp, head, eyes, ears, nose, lips, neck, chest, axillae, abdomen, back, buttocks, bilateral upper extremities, bilateral lower extremities, hands, feet, fingers, toes, fingernails, and toenails. All findings within normal limits unless otherwise noted below.  R upper arm Well healed scar with no evidence of recurrence, no lymphadenopathy.   R nasal ala; L shoulder; R posterior shoulder; L posterior thigh; R mid back; sacrum/spinal lower back  L shoulder 6.0 x 7.65m flesh brown speckled pap  sacrum/spinal lower back 2.568mmedium dark brown macule with notch  L posterior thigh 2.52m452medium dark brown macule  R mid back 7.0 x 6.52mm58mown speckled flesh pap  R posterior shoulder 4.52mm 27mckled brown flat pap  R nasal ala 5.52mm f24mh white pap  R post neck x 1, L upper arm x 1 (2) Stuck on waxy paps with erythema    Assessment & Plan  History of melanoma in situ R upper arm  Excised 2019  Clear. Observe for recurrence. Call clinic for new or changing lesions.  Recommend regular skin exams, daily broad-spectrum  spf 30+ sunscreen use, and photoprotection.    Nevus (6) R nasal ala; L shoulder; R posterior shoulder; L posterior thigh; R mid back; sacrum/spinal lower back  Benign-appearing. Stable compared to previous visit. Observation.  Call clinic for new or changing moles.  Recommend daily use of broad spectrum spf 30+ sunscreen to sun-exposed areas.    R post shoulder Nevus vs SK  Inflamed seborrheic keratosis (2) R post neck x 1, L upper arm x 1  Symptomatic, irritating, patient would like treated.   Destruction of lesion - R post neck x 1, L upper arm x 1  Destruction method: cryotherapy   Informed consent: discussed and consent obtained   Lesion destroyed using liquid nitrogen: Yes   Region frozen until ice ball extended beyond lesion: Yes   Outcome: patient tolerated procedure well with no complications   Post-procedure details: wound care instructions given   Additional details:  Prior to procedure, discussed risks of blister formation, small wound, skin dyspigmentation, or rare scar following cryotherapy. Recommend Vaseline ointment to treated areas while healing.    Lentigines - Scattered tan macules - Due to sun exposure - Benign-appearing, observe - Recommend daily broad spectrum sunscreen SPF 30+ to sun-exposed areas, reapply every 2 hours as needed. - Call for any changes  Seborrheic Keratoses - Stuck-on, waxy, tan-brown papules and/or plaques  - Benign-appearing - Discussed benign etiology and prognosis. - Observe - Call for any changes - arms, back, legs, face  Melanocytic  Nevi - Tan-brown and/or pink-flesh-colored symmetric macules and papules - Benign appearing on exam today - Observation - Call clinic for new or changing moles - Recommend daily use of broad spectrum spf 30+ sunscreen to sun-exposed areas.  - trunk, extremities, L foot, face  Hemangiomas - Red papules - Discussed benign nature - Observe - Call for any changes - abdomen  Actinic  Damage - Chronic condition, secondary to cumulative UV/sun exposure - diffuse scaly erythematous macules with underlying dyspigmentation - Recommend daily broad spectrum sunscreen SPF 30+ to sun-exposed areas, reapply every 2 hours as needed.  - Staying in the shade or wearing long sleeves, sun glasses (UVA+UVB protection) and wide brim hats (4-inch brim around the entire circumference of the hat) are also recommended for sun protection.  - Call for new or changing lesions. - chest  Skin cancer screening performed today.   Sebaceous Hyperplasia - Small yellow papules with a central dell - Benign - Observe  - face  History of Dysplastic Nevi - No evidence of recurrence today - Recommend regular full body skin exams - Recommend daily broad spectrum sunscreen SPF 30+ to sun-exposed areas, reapply every 2 hours as needed.  - Call if any new or changing lesions are noted between office visits  - R post shoulder  Return in about 6 months (around 04/21/2023) for TBSE, Hx of Melanoma IS, Hx of Dysplastic nevi.  I, Othelia Pulling, RMA, am acting as scribe for Brendolyn Patty, MD .  Documentation: I have reviewed the above documentation for accuracy and completeness, and I agree with the above.  Brendolyn Patty MD

## 2023-04-06 ENCOUNTER — Other Ambulatory Visit: Payer: Self-pay | Admitting: Family Medicine

## 2023-04-06 DIAGNOSIS — Z1231 Encounter for screening mammogram for malignant neoplasm of breast: Secondary | ICD-10-CM

## 2023-04-21 ENCOUNTER — Encounter: Payer: Medicare Other | Admitting: Dermatology

## 2023-05-22 ENCOUNTER — Ambulatory Visit
Admission: RE | Admit: 2023-05-22 | Discharge: 2023-05-22 | Disposition: A | Discharge status: Home / Self Care | Payer: Medicare Other | Source: Ambulatory Visit | Attending: Family Medicine | Admitting: Family Medicine

## 2023-05-22 DIAGNOSIS — Z1231 Encounter for screening mammogram for malignant neoplasm of breast: Secondary | ICD-10-CM | POA: Insufficient documentation

## 2023-05-29 ENCOUNTER — Other Ambulatory Visit: Payer: Self-pay | Admitting: Family Medicine

## 2023-05-29 DIAGNOSIS — N63 Unspecified lump in unspecified breast: Secondary | ICD-10-CM

## 2023-05-29 DIAGNOSIS — R928 Other abnormal and inconclusive findings on diagnostic imaging of breast: Secondary | ICD-10-CM

## 2023-06-02 ENCOUNTER — Ambulatory Visit
Admission: RE | Admit: 2023-06-02 | Discharge: 2023-06-02 | Disposition: A | Discharge status: Home / Self Care | Payer: Medicare Other | Source: Ambulatory Visit | Attending: Family Medicine | Admitting: Family Medicine

## 2023-06-02 DIAGNOSIS — R928 Other abnormal and inconclusive findings on diagnostic imaging of breast: Secondary | ICD-10-CM | POA: Diagnosis present

## 2023-06-02 DIAGNOSIS — N63 Unspecified lump in unspecified breast: Secondary | ICD-10-CM | POA: Diagnosis present

## 2023-11-10 ENCOUNTER — Ambulatory Visit: Payer: Medicare Other | Admitting: Dermatology

## 2023-11-10 DIAGNOSIS — Z86006 Personal history of melanoma in-situ: Secondary | ICD-10-CM

## 2023-11-10 DIAGNOSIS — W908XXA Exposure to other nonionizing radiation, initial encounter: Secondary | ICD-10-CM

## 2023-11-10 DIAGNOSIS — D2239 Melanocytic nevi of other parts of face: Secondary | ICD-10-CM

## 2023-11-10 DIAGNOSIS — D229 Melanocytic nevi, unspecified: Secondary | ICD-10-CM

## 2023-11-10 DIAGNOSIS — L821 Other seborrheic keratosis: Secondary | ICD-10-CM

## 2023-11-10 DIAGNOSIS — L814 Other melanin hyperpigmentation: Secondary | ICD-10-CM | POA: Diagnosis not present

## 2023-11-10 DIAGNOSIS — Z1283 Encounter for screening for malignant neoplasm of skin: Secondary | ICD-10-CM | POA: Diagnosis not present

## 2023-11-10 DIAGNOSIS — L738 Other specified follicular disorders: Secondary | ICD-10-CM | POA: Diagnosis not present

## 2023-11-10 DIAGNOSIS — D2261 Melanocytic nevi of right upper limb, including shoulder: Secondary | ICD-10-CM

## 2023-11-10 DIAGNOSIS — Z86018 Personal history of other benign neoplasm: Secondary | ICD-10-CM

## 2023-11-10 DIAGNOSIS — D225 Melanocytic nevi of trunk: Secondary | ICD-10-CM

## 2023-11-10 DIAGNOSIS — D2272 Melanocytic nevi of left lower limb, including hip: Secondary | ICD-10-CM

## 2023-11-10 DIAGNOSIS — L720 Epidermal cyst: Secondary | ICD-10-CM | POA: Diagnosis not present

## 2023-11-10 DIAGNOSIS — D1801 Hemangioma of skin and subcutaneous tissue: Secondary | ICD-10-CM

## 2023-11-10 DIAGNOSIS — L578 Other skin changes due to chronic exposure to nonionizing radiation: Secondary | ICD-10-CM

## 2023-11-10 DIAGNOSIS — D2262 Melanocytic nevi of left upper limb, including shoulder: Secondary | ICD-10-CM

## 2023-11-10 NOTE — Patient Instructions (Addendum)
Seborrheic Keratosis  What causes seborrheic keratoses? Seborrheic keratoses are harmless, common skin growths that first appear during adult life.  As time goes by, more growths appear.  Some people may develop a large number of them.  Seborrheic keratoses appear on both covered and uncovered body parts.  They are not caused by sunlight.  The tendency to develop seborrheic keratoses can be inherited.  They vary in color from skin-colored to gray, brown, or even black.  They can be either smooth or have a rough, warty surface.   Seborrheic keratoses are superficial and look as if they were stuck on the skin.  Under the microscope this type of keratosis looks like layers upon layers of skin.  That is why at times the top layer may seem to fall off, but the rest of the growth remains and re-grows.    Treatment Seborrheic keratoses do not need to be treated, but can easily be removed in the office.  Seborrheic keratoses often cause symptoms when they rub on clothing or jewelry.  Lesions can be in the way of shaving.  If they become inflamed, they can cause itching, soreness, or burning.  Removal of a seborrheic keratosis can be accomplished by freezing, burning, or surgery. If any spot bleeds, scabs, or grows rapidly, please return to have it checked, as these can be an indication of a skin cancer.    Melanoma ABCDEs  Melanoma is the most dangerous type of skin cancer, and is the leading cause of death from skin disease.  You are more likely to develop melanoma if you: Have light-colored skin, light-colored eyes, or red or blond hair Spend a lot of time in the sun Tan regularly, either outdoors or in a tanning bed Have had blistering sunburns, especially during childhood Have a close family member who has had a melanoma Have atypical moles or large birthmarks  Early detection of melanoma is key since treatment is typically straightforward and cure rates are extremely high if we catch it early.    The first sign of melanoma is often a change in a mole or a new dark spot.  The ABCDE system is a way of remembering the signs of melanoma.  A for asymmetry:  The two halves do not match. B for border:  The edges of the growth are irregular. C for color:  A mixture of colors are present instead of an even brown color. D for diameter:  Melanomas are usually (but not always) greater than 6mm - the size of a pencil eraser. E for evolution:  The spot keeps changing in size, shape, and color.  Please check your skin once per month between visits. You can use a small mirror in front and a large mirror behind you to keep an eye on the back side or your body.   If you see any new or changing lesions before your next follow-up, please call to schedule a visit.  Please continue daily skin protection including broad spectrum sunscreen SPF 30+ to sun-exposed areas, reapplying every 2 hours as needed when you're outdoors.   Staying in the shade or wearing long sleeves, sun glasses (UVA+UVB protection) and wide brim hats (4-inch brim around the entire circumference of the hat) are also recommended for sun protection.    Due to recent changes in healthcare laws, you may see results of your pathology and/or laboratory studies on MyChart before the doctors have had a chance to review them. We understand that in some cases there  may be results that are confusing or concerning to you. Please understand that not all results are received at the same time and often the doctors may need to interpret multiple results in order to provide you with the best plan of care or course of treatment. Therefore, we ask that you please give [place] 2 business days to thoroughly review all your results before contacting the office for clarification. Should we see a critical lab result, you will be contacted sooner.   If You Need Anything After Your Visit  If you have any questions or concerns for your doctor, please call our main  line at [phone] and press option 4 to reach your doctor's medical assistant. If no one answers, please leave a voicemail as directed and we will return your call as soon as possible. Messages left after 4 pm will be answered the following business day.   You may also send [place] a message via MyChart. We typically respond to MyChart messages within 1-2 business days.  For prescription refills, please ask your pharmacy to contact our office. Our fax number is [phone].  If you have an urgent issue when the clinic is closed that cannot wait until the next business day, you can page your doctor at the number below.    Please note that while we do our best to be available for urgent issues outside of office hours, we are not available 24/7.   If you have an urgent issue and are unable to reach [place], you may choose to seek medical care at your doctor's office, retail clinic, urgent care center, or emergency room.  If you have a medical emergency, please immediately call 911 or go to the emergency department.  Pager Numbers  - Dr. Gwen Pounds: [phone]  - Dr. Roseanne Reno: [phone]  - Dr. Katrinka Blazing: [phone]   In the event of inclement weather, please call our main line at [phone] for an update on the status of any delays or closures.  Dermatology Medication Tips: Please keep the boxes that topical medications come in in order to help keep track of the instructions about where and how to use these. Pharmacies typically print the medication instructions only on the boxes and not directly on the medication tubes.   If your medication is too expensive, please contact our office at [phone] option 4 or send [place] a message through MyChart.   We are unable to tell what your co-pay for medications will be in advance as this is different depending on your insurance coverage. However, we may be able to find a substitute medication at lower cost or fill out paperwork to get insurance to cover  a needed medication.   If a prior authorization is required to get your medication covered by your insurance company, please allow [place] 1-2 business days to complete this process.  Drug prices often vary depending on where the prescription is filled and some pharmacies may offer cheaper prices.  The website www.goodrx.com contains coupons for medications through different pharmacies. The prices here do not account for what the cost may be with help from insurance (it may be cheaper with your insurance), but the website can give you the price if you did not use any insurance.  - You can print the associated coupon and take it with your prescription to the pharmacy.  - You may also stop by our office during regular business hours and pick up a GoodRx coupon card.  - If you need your prescription sent electronically  to a different pharmacy, notify our office through [facility] or by phone at [phone] option 4.     Si Usted Necesita Algo Despus de Su Visita  Tambin puede enviarnos un mensaje a travs de Clinical cytogeneticist. Por lo general respondemos a los mensajes de MyChart en el transcurso de 1 a 2 das hbiles.  Para renovar recetas, por favor pida a su farmacia que se ponga en contacto con nuestra oficina. Annie Sable de fax es [place] [phone].  Si tiene un asunto urgente cuando la clnica est cerrada y que no puede esperar hasta el siguiente da hbil, puede llamar/localizar a su doctor(a) al nmero que aparece a continuacin.   Por favor, tenga en cuenta que aunque hacemos todo lo posible para estar disponibles para asuntos urgentes fuera del horario de [place], no estamos disponibles las 24 horas del da, los 7 [address]  [address] de la [place].   Si tiene un problema urgente y no puede comunicarse con nosotros, puede optar por buscar atencin mdica  en el consultorio de su doctor(a), en una clnica privada, en un centro de atencin urgente o en una sala de emergencias.  Si tiene Psychologist, clinical, por favor llame inmediatamente al 911 o vaya a la sala de emergencias.  Nmeros de bper  - Dr. Gwen Pounds: [phone]  - Dra. Roseanne Reno: [phone]  - Dr. Katrinka Blazing: [phone]   En caso de inclemencias del tiempo, por favor llame a Lacy Duverney principal al [phone] para una actualizacin sobre el [place] de cualquier retraso o cierre.  Consejos para la medicacin en dermatologa: Por favor, guarde las cajas en las que vienen los medicamentos de uso tpico para ayudarle a seguir las instrucciones sobre dnde y cmo usarlos. Las farmacias generalmente imprimen las instrucciones del medicamento slo en las cajas y no directamente en los tubos del [place].   Si su medicamento es muy caro, por favor, pngase en contacto con Rolm Gala llamando al [phone] y presione la opcin 4 o envenos un mensaje a travs de Clinical cytogeneticist.   No podemos decirle cul ser su copago por los medicamentos por adelantado ya que esto es diferente dependiendo de la cobertura de su seguro. Sin embargo, es posible que podamos encontrar un medicamento sustituto a Audiological scientist un formulario para que el seguro cubra el medicamento que se considera necesario.   Si se requiere una autorizacin previa para que su compaa de seguros [place] su medicamento, por favor permtanos de 1 a 2 das hbiles para completar [address]  Los precios de los medicamentos varan con frecuencia dependiendo del Environmental consultant de dnde se surte la receta y alguna farmacias pueden ofrecer precios ms baratos.  El sitio web www.goodrx.com tiene cupones para medicamentos de Health and safety inspector. Los precios aqu no tienen en cuenta lo que podra costar con la ayuda del seguro (puede ser ms barato con su seguro), pero el sitio web puede darle el precio si no utiliz Tourist information centre manager.  - Puede imprimir el cupn correspondiente y llevarlo con su receta a la farmacia.  - Tambin puede pasar por nuestra oficina durante el horario de  atencin regular y Education officer, museum una tarjeta de cupones de GoodRx.  - Si necesita que su receta se enve electrnicamente a una farmacia diferente, informe a nuestra oficina a travs de MyChart de  o por telfono llamando al [phone] y presione la opcin 4.

## 2023-11-10 NOTE — Progress Notes (Signed)
Follow-Up Visit   Subjective  Lynn Sandoval is a 72 y.o. female who presents for the following: Skin Cancer Screening and Full Body Skin Exam  The patient presents for Total-Body Skin Exam (TBSE) for skin cancer screening and mole check. The patient has spots, moles and lesions to be evaluated, some may be new or changing. She has a history of melanoma in situ of the right upper arm, 2019. History of dysplastic nevi.      The following portions of the chart were reviewed this encounter and updated as appropriate: medications, allergies, medical history  Review of Systems:  No other skin or systemic complaints except as noted in HPI or Assessment and Plan.  Objective  Well appearing patient in no apparent distress; mood and affect are within normal limits.  A full examination was performed including scalp, head, eyes, ears, nose, lips, neck, chest, axillae, abdomen, back, buttocks, bilateral upper extremities, bilateral lower extremities, hands, feet, fingers, toes, fingernails, and toenails. All findings within normal limits unless otherwise noted below.   Relevant physical exam findings are noted in the Assessment and Plan.    Assessment & Plan   SKIN CANCER SCREENING PERFORMED TODAY.  ACTINIC DAMAGE - Chronic condition, secondary to cumulative UV/sun exposure - diffuse scaly erythematous macules with underlying dyspigmentation - Recommend daily broad spectrum sunscreen SPF 30+ to sun-exposed areas, reapply every 2 hours as needed.  - Staying in the shade or wearing long sleeves, sun glasses (UVA+UVB protection) and wide brim hats (4-inch brim around the entire circumference of the hat) are also recommended for sun protection.  - Call for new or changing lesions.  LENTIGINES, SEBORRHEIC KERATOSES, HEMANGIOMAS - Benign normal skin lesions - Benign-appearing - Call for any changes  MELANOCYTIC NEVI - Tan-brown and/or pink-flesh-colored symmetric macules and papules - L  shoulder 6.0 x 8.59mm flesh brown speckled pap  - sacrum/spinal lower back 2.21mm medium dark brown macule with notch  - L posterior thigh 2.30mm medium dark brown macule  - R mid back 7.0 x 6.7mm brown speckled flesh pap  - R posterior shoulder 4.37mm speckled brown flat pap (Nevus vs SK)   - R nasal ala 5.26mm flesh white pap - Left foot dorsum 4.0 mm brown papule - Benign appearing on exam today, Stable compared to previous visit.  - Observation - Call clinic for new or changing moles - Recommend daily use of broad spectrum spf 30+ sunscreen to sun-exposed areas.   Sebaceous Hyperplasia - Small yellow papules with a central dell - Benign-appearing - Observe. Call for changes.  HISTORY OF MELANOMA IN SITU Right upper arm, 2019 - No evidence of recurrence today - Recommend regular full body skin exams - Recommend daily broad spectrum sunscreen SPF 30+ to sun-exposed areas, reapply every 2 hours as needed.  - Call if any new or changing lesions are noted between office visits  History of Dysplastic Nevi - No evidence of recurrence today - Recommend regular full body skin exams - Recommend daily broad spectrum sunscreen SPF 30+ to sun-exposed areas, reapply every 2 hours as needed.  - Call if any new or changing lesions are noted between office visits   Milia - tiny firm white papule at right medial upper eyelid margin - type of cyst - benign - observe - If patient wants removed, recommend patient discuss with eye doctor.   Return in about 1 year (around 11/09/2024).  ICherlyn Labella, CMA, am acting as scribe for Willeen Niece, MD .  Documentation: I have reviewed the above documentation for accuracy and completeness, and I agree with the above.  Willeen Niece, MD

## 2024-04-20 ENCOUNTER — Other Ambulatory Visit: Payer: Self-pay | Admitting: Family Medicine

## 2024-04-20 DIAGNOSIS — Z1231 Encounter for screening mammogram for malignant neoplasm of breast: Secondary | ICD-10-CM

## 2024-05-24 ENCOUNTER — Ambulatory Visit
Admission: RE | Admit: 2024-05-24 | Discharge: 2024-05-24 | Disposition: A | Discharge status: Home / Self Care | Source: Ambulatory Visit | Attending: Family Medicine | Admitting: Family Medicine

## 2024-05-24 DIAGNOSIS — Z1231 Encounter for screening mammogram for malignant neoplasm of breast: Secondary | ICD-10-CM | POA: Insufficient documentation

## 2024-11-15 ENCOUNTER — Ambulatory Visit: Payer: Medicare Other | Admitting: Dermatology

## 2024-11-15 ENCOUNTER — Encounter: Payer: Self-pay | Admitting: Dermatology

## 2024-11-15 DIAGNOSIS — D1801 Hemangioma of skin and subcutaneous tissue: Secondary | ICD-10-CM

## 2024-11-15 DIAGNOSIS — Z1283 Encounter for screening for malignant neoplasm of skin: Secondary | ICD-10-CM

## 2024-11-15 DIAGNOSIS — Z86006 Personal history of melanoma in-situ: Secondary | ICD-10-CM

## 2024-11-15 DIAGNOSIS — L578 Other skin changes due to chronic exposure to nonionizing radiation: Secondary | ICD-10-CM | POA: Diagnosis not present

## 2024-11-15 DIAGNOSIS — D225 Melanocytic nevi of trunk: Secondary | ICD-10-CM

## 2024-11-15 DIAGNOSIS — D2239 Melanocytic nevi of other parts of face: Secondary | ICD-10-CM

## 2024-11-15 DIAGNOSIS — D229 Melanocytic nevi, unspecified: Secondary | ICD-10-CM

## 2024-11-15 DIAGNOSIS — D2262 Melanocytic nevi of left upper limb, including shoulder: Secondary | ICD-10-CM

## 2024-11-15 DIAGNOSIS — D239 Other benign neoplasm of skin, unspecified: Secondary | ICD-10-CM

## 2024-11-15 DIAGNOSIS — L738 Other specified follicular disorders: Secondary | ICD-10-CM

## 2024-11-15 DIAGNOSIS — L821 Other seborrheic keratosis: Secondary | ICD-10-CM

## 2024-11-15 DIAGNOSIS — D2372 Other benign neoplasm of skin of left lower limb, including hip: Secondary | ICD-10-CM

## 2024-11-15 DIAGNOSIS — L814 Other melanin hyperpigmentation: Secondary | ICD-10-CM | POA: Diagnosis not present

## 2024-11-15 DIAGNOSIS — W908XXA Exposure to other nonionizing radiation, initial encounter: Secondary | ICD-10-CM

## 2024-11-15 DIAGNOSIS — Z86018 Personal history of other benign neoplasm: Secondary | ICD-10-CM

## 2024-11-15 DIAGNOSIS — D2272 Melanocytic nevi of left lower limb, including hip: Secondary | ICD-10-CM

## 2024-11-15 NOTE — Patient Instructions (Addendum)
 Recommend daily broad spectrum sunscreen SPF 30+ to sun-exposed areas, reapply every 2 hours as needed. Call for new or changing lesions.  Staying in the shade or wearing long sleeves, sun glasses (UVA+UVB protection) and wide brim hats (4-inch brim around the entire circumference of the hat) are also recommended for sun protection.    Seborrheic Keratosis  What causes seborrheic keratoses? Seborrheic keratoses are harmless, common skin growths that first appear during adult life.  As time goes by, more growths appear.  Some people may develop a large number of them.  Seborrheic keratoses appear on both covered and uncovered body parts.  They are not caused by sunlight.  The tendency to develop seborrheic keratoses can be inherited.  They vary in color from skin-colored to gray, brown, or even black.  They can be either smooth or have a rough, warty surface.   Seborrheic keratoses are superficial and look as if they were stuck on the skin.  Under the microscope this type of keratosis looks like layers upon layers of skin.  That is why at times the top layer may seem to fall off, but the rest of the growth remains and re-grows.    Treatment Seborrheic keratoses do not need to be treated, but can easily be removed in the office.  Seborrheic keratoses often cause symptoms when they rub on clothing or jewelry.  Lesions can be in the way of shaving.  If they become inflamed, they can cause itching, soreness, or burning.  Removal of a seborrheic keratosis can be accomplished by freezing, burning, or surgery. If any spot bleeds, scabs, or grows rapidly, please return to have it checked, as these can be an indication of a skin cancer.     Melanoma ABCDEs  Melanoma is the most dangerous type of skin cancer, and is the leading cause of death from skin disease.  You are more likely to develop melanoma if you: Have light-colored skin, light-colored eyes, or red or blond hair Spend a lot of time in the  sun Tan regularly, either outdoors or in a tanning bed Have had blistering sunburns, especially during childhood Have a close family member who has had a melanoma Have atypical moles or large birthmarks  Early detection of melanoma is key since treatment is typically straightforward and cure rates are extremely high if we catch it early.   The first sign of melanoma is often a change in a mole or a new dark spot.  The ABCDE system is a way of remembering the signs of melanoma.  A for asymmetry:  The two halves do not match. B for border:  The edges of the growth are irregular. C for color:  A mixture of colors are present instead of an even brown color. D for diameter:  Melanomas are usually (but not always) greater than 6mm - the size of a pencil eraser. E for evolution:  The spot keeps changing in size, shape, and color.  Please check your skin once per month between visits. You can use a small mirror in front and a large mirror behind you to keep an eye on the back side or your body.   If you see any new or changing lesions before your next follow-up, please call to schedule a visit.  Please continue daily skin protection including broad spectrum sunscreen SPF 30+ to sun-exposed areas, reapplying every 2 hours as needed when you're outdoors.   Staying in the shade or wearing long sleeves, sun glasses (UVA+UVB protection)  and wide brim hats (4-inch brim around the entire circumference of the hat) are also recommended for sun protection.      Due to recent changes in healthcare laws, you may see results of your pathology and/or laboratory studies on MyChart before the doctors have had a chance to review them. We understand that in some cases there may be results that are confusing or concerning to you. Please understand that not all results are received at the same time and often the doctors may need to interpret multiple results in order to provide you with the best plan of care or course  of treatment. Therefore, we ask that you please give us  2 business days to thoroughly review all your results before contacting the office for clarification. Should we see a critical lab result, you will be contacted sooner.   If You Need Anything After Your Visit  If you have any questions or concerns for your doctor, please call our main line at [phone] and press option 4 to reach your doctor's medical assistant. If no one answers, please leave a voicemail as directed and we will return your call as soon as possible. Messages left after 4 pm will be answered the following business day.   You may also send us  a message via MyChart. We typically respond to MyChart messages within 1-2 business days.  For prescription refills, please ask your pharmacy to contact our office. Our fax number is [phone].  If you have an urgent issue when the clinic is closed that cannot wait until the next business day, you can page your doctor at the number below.    Please note that while we do our best to be available for urgent issues outside of office hours, we are not available 24/7.   If you have an urgent issue and are unable to reach us , you may choose to seek medical care at your doctor's office, retail clinic, urgent care center, or emergency room.  If you have a medical emergency, please immediately call 911 or go to the emergency department.  Pager Numbers  - Dr. Hester: [phone]  - Dr. Jackquline: [phone]  - Dr. Claudene: [phone]   - Dr. Raymund: [phone]  In the event of inclement weather, please call our main line at [phone] for an update on the status of any delays or closures.  Dermatology Medication Tips: Please keep the boxes that topical medications come in in order to help keep track of the instructions about where and how to use these. Pharmacies typically print the medication instructions only on the boxes and not directly on the medication tubes.   If  your medication is too expensive, please contact our office at [phone] option 4 or send us  a message through MyChart.   We are unable to tell what your co-pay for medications will be in advance as this is different depending on your insurance coverage. However, we may be able to find a substitute medication at lower cost or fill out paperwork to get insurance to cover a needed medication.   If a prior authorization is required to get your medication covered by your insurance company, please allow us  1-2 business days to complete this process.  Drug prices often vary depending on where the prescription is filled and some pharmacies may offer cheaper prices.  The website www.goodrx.com contains coupons for medications through different pharmacies. The prices here do not account for what the cost may be with help from insurance (it may be  cheaper with your insurance), but the website can give you the price if you did not use any insurance.  - You can print the associated coupon and take it with your prescription to the pharmacy.  - You may also stop by our office during regular business hours and pick up a GoodRx coupon card.  - If you need your prescription sent electronically to a different pharmacy, notify our office through [facility] or by phone at [phone] option 4.     Si Usted Necesita Algo Despus de Su Visita  Tambin puede enviarnos un mensaje a travs de Clinical cytogeneticist. Por lo general respondemos a los mensajes de MyChart en el transcurso de 1 a 2 das hbiles.  Para renovar recetas, por favor pida a su farmacia que se ponga en contacto con nuestra oficina. Randi lakes de fax es [place] [phone].  Si tiene un asunto urgente cuando la clnica est cerrada y que no puede esperar hasta el siguiente da hbil, puede llamar/localizar a su doctor(a) al nmero que aparece a continuacin.   Por favor, tenga en cuenta que aunque hacemos todo lo posible para estar disponibles para  asuntos urgentes fuera del horario de [place], no estamos disponibles las 24 horas del da, los 7 [address]  [address] de la [place].   Si tiene un problema urgente y no puede comunicarse con nosotros, puede optar por buscar atencin mdica  en el consultorio de su doctor(a), en una clnica privada, en un centro de atencin urgente o en una sala de emergencias.  Si tiene Engineer, drilling, por favor llame inmediatamente al 911 o vaya a la sala de emergencias.  Nmeros de bper  - Dr. Hester: [phone]  - Dra. Jackquline: [phone]  - Dr. Claudene: [phone]  - Dra. Kitts: [phone]  En caso de inclemencias del [place], por favor llame a nuestra lnea principal al [phone] para una actualizacin sobre el estado de cualquier retraso o cierre.  Consejos para la medicacin en dermatologa: Por favor, guarde las cajas en las que vienen los medicamentos de uso tpico para ayudarle a seguir las instrucciones sobre dnde y cmo usarlos. Las farmacias generalmente imprimen las instrucciones del medicamento slo en las cajas y no directamente en los tubos del [place].   Si su medicamento es muy caro, por favor, pngase en contacto con landry rieger llamando al [phone] y presione la opcin 4 o envenos un mensaje a travs de Clinical cytogeneticist.   No podemos decirle cul ser su copago por los medicamentos por adelantado ya que esto es diferente dependiendo de la cobertura de su seguro. Sin embargo, es posible que podamos encontrar un medicamento sustituto a Audiological scientist un formulario para que el seguro cubra el medicamento que se considera necesario.   Si se requiere una autorizacin previa para que su compaa de seguros malta su medicamento, por favor permtanos de 1 a 2 das hbiles para completar este proceso.  Los precios de los medicamentos varan con frecuencia dependiendo del Environmental consultant de dnde se surte la receta y alguna farmacias pueden ofrecer precios ms baratos.  El sitio web  www.goodrx.com tiene cupones para medicamentos de Health and safety inspector. Los precios aqu no tienen en cuenta lo que podra costar con la ayuda del seguro (puede ser ms barato con su seguro), pero el sitio web puede darle el precio si no utiliz Tourist information centre manager.  - Puede imprimir el cupn correspondiente y llevarlo con su receta a la farmacia.  - Tambin puede pasar por nuestra oficina durante el horario  de atencin regular y Education officer, museum una tarjeta de cupones de GoodRx.  - Si necesita que su receta se enve electrnicamente a una farmacia diferente, informe a nuestra oficina a travs de MyChart de [place] o por telfono llamando al [phone] y presione la opcin 4.

## 2024-11-15 NOTE — Progress Notes (Signed)
 Follow-Up Visit   Subjective  Lynn Sandoval is a 73 y.o. female who presents for the following: Skin Cancer Screening and Full Body Skin Exam.  The patient presents for Total-Body Skin Exam (TBSE) for skin cancer screening and mole check. The patient has spots, moles and lesions to be evaluated, some may be new or changing. She has a history of melanoma in situ of the right upper arm, 2019. History of dysplastic nevi.      The following portions of the chart were reviewed this encounter and updated as appropriate: medications, allergies, medical history  Review of Systems:  No other skin or systemic complaints except as noted in HPI or Assessment and Plan.  Objective  Well appearing patient in no apparent distress; mood and affect are within normal limits.  A full examination was performed including scalp, head, eyes, ears, nose, lips, neck, chest, axillae, abdomen, back, buttocks, bilateral upper extremities, bilateral lower extremities, hands, feet, fingers, toes, fingernails, and toenails. All findings within normal limits unless otherwise noted below.   Relevant physical exam findings are noted in the Assessment and Plan.     Assessment & Plan   SKIN CANCER SCREENING PERFORMED TODAY.  ACTINIC DAMAGE - Chronic condition, secondary to cumulative UV/sun exposure - diffuse scaly erythematous macules with underlying dyspigmentation - Recommend daily broad spectrum sunscreen SPF 30+ to sun-exposed areas, reapply every 2 hours as needed.  - Staying in the shade or wearing long sleeves, sun glasses (UVA+UVB protection) and wide brim hats (4-inch brim around the entire circumference of the hat) are also recommended for sun protection.  - Call for new or changing lesions.  LENTIGINES, SEBORRHEIC KERATOSES, HEMANGIOMAS - Benign normal skin lesions - Benign-appearing - Call for any changes  MELANOCYTIC NEVI - Tan-brown and/or pink-flesh-colored symmetric macules and papules - L  shoulder 6 x 8 mm flesh brown speckled pap - sacrum/spinal lower back 2.5 mm medium dark brown macule with notch - L posterior thigh 2 mm medium dark brown macule - R mid back 7 x 6 mm brown speckled flesh papule - R nasal ala 5 mm flesh white papule, present for years no changes - Left foot dorsum 4 mm brown papule - Benign appearing on exam today, Stable compared to previous visit.  - Observation - Call clinic for new or changing moles - Recommend daily use of broad spectrum spf 30+ sunscreen to sun-exposed areas.   HISTORY OF MELANOMA IN SITU Right upper arm, 2019 - No evidence of recurrence today - No lymphadenopathy  - Recommend regular full body skin exams - Recommend daily broad spectrum sunscreen SPF 30+ to sun-exposed areas, reapply every 2 hours as needed.  - Call if any new or changing lesions are noted between office visits  History of Dysplastic Nevi - No evidence of recurrence today - Recommend regular full body skin exams - Recommend daily broad spectrum sunscreen SPF 30+ to sun-exposed areas, reapply every 2 hours as needed.  - Call if any new or changing lesions are noted between office visits   DERMATOFIBROMA Exam: Firm pink/brown papulenodule with dimple sign at left medial knee.  Treatment Plan: A dermatofibroma is a benign growth possibly related to trauma, such as an insect bite, cut from shaving, or inflamed acne-type bump.  Treatment options to remove include shave or excision with resulting scar and risk of recurrence.  Since benign-appearing and not bothersome, will observe for now.   Sebaceous Hyperplasia - Small yellow papules with a central dell at  forehead - Benign-appearing - Observe. Call for changes.     Return in about 1 year (around 11/15/2025) for TBSE, HxMIS, HxDN.  I, Jill Parcell, CMA, am acting as scribe for Rexene Rattler, MD.    Documentation: I have reviewed the above documentation for accuracy and completeness, and I agree with the  above.  Rexene Rattler, MD

## 2025-11-14 ENCOUNTER — Ambulatory Visit: Admitting: Dermatology
